# Patient Record
Sex: Female | Born: 1965
Health system: Southern US, Community
[De-identification: ages and names within clinical notes are randomized; demographics above are authoritative.]

## PROBLEM LIST (undated history)

## (undated) DIAGNOSIS — I2510 Atherosclerotic heart disease of native coronary artery without angina pectoris: Secondary | ICD-10-CM

## (undated) DIAGNOSIS — J449 Chronic obstructive pulmonary disease, unspecified: Secondary | ICD-10-CM

## (undated) DIAGNOSIS — F319 Bipolar disorder, unspecified: Secondary | ICD-10-CM

## (undated) DIAGNOSIS — M545 Low back pain, unspecified: Secondary | ICD-10-CM

## (undated) DIAGNOSIS — R011 Cardiac murmur, unspecified: Secondary | ICD-10-CM

## (undated) DIAGNOSIS — IMO0002 Reserved for concepts with insufficient information to code with codable children: Secondary | ICD-10-CM

## (undated) DIAGNOSIS — K859 Acute pancreatitis without necrosis or infection, unspecified: Secondary | ICD-10-CM

## (undated) DIAGNOSIS — M1990 Unspecified osteoarthritis, unspecified site: Secondary | ICD-10-CM

## (undated) DIAGNOSIS — I639 Cerebral infarction, unspecified: Secondary | ICD-10-CM

## (undated) DIAGNOSIS — R51 Headache: Secondary | ICD-10-CM

## (undated) DIAGNOSIS — M542 Cervicalgia: Secondary | ICD-10-CM

## (undated) DIAGNOSIS — F329 Major depressive disorder, single episode, unspecified: Secondary | ICD-10-CM

## (undated) DIAGNOSIS — G8929 Other chronic pain: Secondary | ICD-10-CM

## (undated) DIAGNOSIS — F32A Depression, unspecified: Secondary | ICD-10-CM

## (undated) DIAGNOSIS — F419 Anxiety disorder, unspecified: Secondary | ICD-10-CM

## (undated) HISTORY — DX: Acute pancreatitis without necrosis or infection, unspecified: K85.90

## (undated) HISTORY — PX: CHOLECYSTECTOMY: SHX55

## (undated) HISTORY — DX: Major depressive disorder, single episode, unspecified: F32.9

## (undated) HISTORY — DX: Bipolar disorder, unspecified: F31.9

## (undated) HISTORY — DX: Cervicalgia: M54.2

## (undated) HISTORY — DX: Low back pain, unspecified: M54.50

## (undated) HISTORY — DX: Atherosclerotic heart disease of native coronary artery without angina pectoris: I25.10

## (undated) HISTORY — DX: Cerebral infarction, unspecified: I63.9

## (undated) HISTORY — DX: Anxiety disorder, unspecified: F41.9

## (undated) HISTORY — DX: Other chronic pain: G89.29

## (undated) HISTORY — PX: APPENDECTOMY: SHX54

## (undated) HISTORY — DX: Depression, unspecified: F32.A

## (undated) HISTORY — DX: Chronic obstructive pulmonary disease, unspecified: J44.9

## (undated) HISTORY — DX: Low back pain: M54.5

## (undated) HISTORY — DX: Unspecified osteoarthritis, unspecified site: M19.90

## (undated) HISTORY — PX: NECK SURGERY: SHX720

## (undated) HISTORY — DX: Reserved for concepts with insufficient information to code with codable children: IMO0002

---

## 1999-08-22 HISTORY — PX: KNEE ARTHROSCOPY: SUR90

## 2011-11-23 ENCOUNTER — Encounter: Payer: Self-pay | Admitting: Family Medicine

## 2011-11-23 ENCOUNTER — Ambulatory Visit (INDEPENDENT_AMBULATORY_CARE_PROVIDER_SITE_OTHER): Payer: Medicare Other | Admitting: Family Medicine

## 2011-11-23 VITALS — BP 108/82 | HR 71 | Resp 15 | Ht 67.0 in | Wt 186.8 lb

## 2011-11-23 DIAGNOSIS — Z720 Tobacco use: Secondary | ICD-10-CM

## 2011-11-23 DIAGNOSIS — J40 Bronchitis, not specified as acute or chronic: Secondary | ICD-10-CM | POA: Diagnosis not present

## 2011-11-23 DIAGNOSIS — M549 Dorsalgia, unspecified: Secondary | ICD-10-CM

## 2011-11-23 DIAGNOSIS — M542 Cervicalgia: Secondary | ICD-10-CM

## 2011-11-23 DIAGNOSIS — E119 Type 2 diabetes mellitus without complications: Secondary | ICD-10-CM | POA: Diagnosis not present

## 2011-11-23 DIAGNOSIS — F319 Bipolar disorder, unspecified: Secondary | ICD-10-CM

## 2011-11-23 DIAGNOSIS — G8929 Other chronic pain: Secondary | ICD-10-CM

## 2011-11-23 DIAGNOSIS — F17200 Nicotine dependence, unspecified, uncomplicated: Secondary | ICD-10-CM

## 2011-11-23 MED ORDER — IBUPROFEN 600 MG PO TABS: 600.0000 mg | ORAL_TABLET | Freq: Four times a day (QID) | ORAL | Status: AC | PRN

## 2011-11-23 MED ORDER — PREDNISONE 10 MG PO TABS: ORAL_TABLET | ORAL | Status: DC

## 2011-11-23 MED ORDER — DOXYCYCLINE HYCLATE 100 MG PO TABS: 100.0000 mg | ORAL_TABLET | Freq: Two times a day (BID) | ORAL | Status: AC

## 2011-11-23 MED ORDER — SERTRALINE HCL 50 MG PO TABS: ORAL_TABLET | ORAL | Status: DC

## 2011-11-23 MED ORDER — LAMOTRIGINE 150 MG PO TABS: 150.0000 mg | ORAL_TABLET | Freq: Two times a day (BID) | ORAL | Status: DC

## 2011-11-23 MED ORDER — ALBUTEROL SULFATE HFA 108 (90 BASE) MCG/ACT IN AERS: 2.0000 | INHALATION_SPRAY | Freq: Four times a day (QID) | RESPIRATORY_TRACT | Status: DC | PRN

## 2011-11-23 MED ORDER — LAMOTRIGINE 150 MG PO TABS: 150.0000 mg | ORAL_TABLET | Freq: Every day | ORAL | Status: DC

## 2011-11-23 MED ORDER — QUETIAPINE FUMARATE 25 MG PO TABS: 25.0000 mg | ORAL_TABLET | Freq: Every day | ORAL | Status: DC

## 2011-11-23 MED ORDER — CYCLOBENZAPRINE HCL 10 MG PO TABS: 10.0000 mg | ORAL_TABLET | Freq: Three times a day (TID) | ORAL | Status: DC | PRN

## 2011-11-23 NOTE — Assessment & Plan Note (Signed)
Flexeril and NSAIDS given,

## 2011-11-23 NOTE — Assessment & Plan Note (Signed)
Will treat with IM solumedrol, prednisone taper and antibiotics. She likley has some COPD with her tobacco history, Will obtain records, she will need PFT
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
## 2011-11-23 NOTE — Patient Instructions (Signed)
Schedule a physical exam in 6 weeks Continue current medications I will send a referral to [facility] Start the prednisone tomorrow Take the antibiotics as prescribed You need to quit smoking!! I will review your records

## 2011-11-23 NOTE — Assessment & Plan Note (Signed)
Check A1C 

## 2011-11-23 NOTE — Assessment & Plan Note (Signed)
Medications have been refilled, she needs to be followed by psych Will refer to [facility]

## 2011-11-23 NOTE — Assessment & Plan Note (Signed)
Discussed importance of tobacco cessation. 

## 2011-11-24 DIAGNOSIS — J40 Bronchitis, not specified as acute or chronic: Secondary | ICD-10-CM | POA: Diagnosis not present

## 2011-11-24 MED ORDER — METHYLPREDNISOLONE SODIUM SUCC 125 MG IJ SOLR
125.0000 mg | Freq: Once | INTRAMUSCULAR | Status: AC
  Administered 2011-11-24: 125 mg via INTRAMUSCULAR

## 2011-11-24 NOTE — Progress Notes (Signed)
Addended by: Abner Greenspan on: 11/24/2011 11:08 AM   Modules accepted: Orders

## 2011-12-17 DIAGNOSIS — M542 Cervicalgia: Secondary | ICD-10-CM | POA: Diagnosis not present

## 2011-12-17 DIAGNOSIS — M5030 Other cervical disc degeneration, unspecified cervical region: Secondary | ICD-10-CM | POA: Diagnosis not present

## 2011-12-17 DIAGNOSIS — M47812 Spondylosis without myelopathy or radiculopathy, cervical region: Secondary | ICD-10-CM | POA: Diagnosis not present

## 2011-12-17 DIAGNOSIS — R209 Unspecified disturbances of skin sensation: Secondary | ICD-10-CM | POA: Diagnosis not present

## 2011-12-22 ENCOUNTER — Telehealth: Payer: Self-pay | Admitting: Family Medicine

## 2011-12-27 MED ORDER — DOXYCYCLINE HYCLATE 100 MG PO TABS: 100.0000 mg | ORAL_TABLET | Freq: Two times a day (BID) | ORAL | Status: AC

## 2011-12-27 NOTE — Telephone Encounter (Signed)
Called and left message for pt to return call.  

## 2011-12-27 NOTE — Telephone Encounter (Signed)
Spoke with pt and she is aware of abt order. Appointment for 5/9 cancelled. Will continue with appointment for 5/16.

## 2011-12-27 NOTE — Telephone Encounter (Signed)
Will send in doxyc to cover lyme disease from tick bite, this can be rechecked at her appt next week. If she gets fever, joint swelling or worsening rash please call for earlier appt

## 2011-12-28 ENCOUNTER — Telehealth: Payer: Self-pay

## 2011-12-28 ENCOUNTER — Ambulatory Visit: Payer: Medicare Other | Admitting: Family Medicine

## 2011-12-28 NOTE — Telephone Encounter (Signed)
Called and left message for pt to return call.  

## 2011-12-28 NOTE — Telephone Encounter (Signed)
She can take the ibuprofen I prescribed for her neck pain along with her flexeril for the pain. There is a small interaction with tramadol, she can try as directed for 24 hours if she has nausea/vomiting, abd pain or headache she needs to stop. Keep her follow-up appt

## 2012-01-04 ENCOUNTER — Encounter: Payer: Medicare Other | Admitting: Family Medicine

## 2012-01-04 NOTE — Telephone Encounter (Signed)
Pt has appointment today.  

## 2012-01-10 DIAGNOSIS — S335XXA Sprain of ligaments of lumbar spine, initial encounter: Secondary | ICD-10-CM | POA: Diagnosis not present

## 2012-01-10 DIAGNOSIS — IMO0002 Reserved for concepts with insufficient information to code with codable children: Secondary | ICD-10-CM | POA: Diagnosis not present

## 2012-01-10 DIAGNOSIS — F17200 Nicotine dependence, unspecified, uncomplicated: Secondary | ICD-10-CM | POA: Diagnosis not present

## 2012-01-10 DIAGNOSIS — E119 Type 2 diabetes mellitus without complications: Secondary | ICD-10-CM | POA: Diagnosis not present

## 2012-01-10 DIAGNOSIS — Z79899 Other long term (current) drug therapy: Secondary | ICD-10-CM | POA: Diagnosis not present

## 2012-01-10 DIAGNOSIS — F319 Bipolar disorder, unspecified: Secondary | ICD-10-CM | POA: Diagnosis not present

## 2012-01-11 ENCOUNTER — Encounter: Payer: Medicare Other | Admitting: Family Medicine

## 2012-01-11 ENCOUNTER — Encounter: Payer: Self-pay | Admitting: Family Medicine

## 2012-01-16 ENCOUNTER — Telehealth: Payer: Self-pay | Admitting: Family Medicine

## 2012-01-23 NOTE — Telephone Encounter (Signed)
Pt re-informed on the advice given in the past about interaction with tramadol and Zoloft. Understanding to stop if any sign of interaction.  Patient states that she has an appointment on 6/7

## 2012-01-26 ENCOUNTER — Encounter: Payer: Self-pay | Admitting: Family Medicine

## 2012-01-26 ENCOUNTER — Ambulatory Visit (INDEPENDENT_AMBULATORY_CARE_PROVIDER_SITE_OTHER): Payer: Medicare Other | Admitting: Family Medicine

## 2012-01-26 VITALS — BP 142/78 | HR 106 | Resp 18 | Ht 67.0 in | Wt 183.1 lb

## 2012-01-26 DIAGNOSIS — R7989 Other specified abnormal findings of blood chemistry: Secondary | ICD-10-CM

## 2012-01-26 DIAGNOSIS — M549 Dorsalgia, unspecified: Secondary | ICD-10-CM | POA: Diagnosis not present

## 2012-01-26 DIAGNOSIS — E119 Type 2 diabetes mellitus without complications: Secondary | ICD-10-CM

## 2012-01-26 DIAGNOSIS — G8929 Other chronic pain: Secondary | ICD-10-CM | POA: Diagnosis not present

## 2012-01-26 DIAGNOSIS — F17200 Nicotine dependence, unspecified, uncomplicated: Secondary | ICD-10-CM

## 2012-01-26 DIAGNOSIS — Z0000 Encounter for general adult medical examination without abnormal findings: Secondary | ICD-10-CM

## 2012-01-26 DIAGNOSIS — R946 Abnormal results of thyroid function studies: Secondary | ICD-10-CM | POA: Diagnosis not present

## 2012-01-26 DIAGNOSIS — IMO0001 Reserved for inherently not codable concepts without codable children: Secondary | ICD-10-CM

## 2012-01-26 DIAGNOSIS — Z720 Tobacco use: Secondary | ICD-10-CM

## 2012-01-26 DIAGNOSIS — R030 Elevated blood-pressure reading, without diagnosis of hypertension: Secondary | ICD-10-CM

## 2012-01-26 DIAGNOSIS — F319 Bipolar disorder, unspecified: Secondary | ICD-10-CM

## 2012-01-26 DIAGNOSIS — Z1231 Encounter for screening mammogram for malignant neoplasm of breast: Secondary | ICD-10-CM

## 2012-01-26 LAB — CBC
HCT: 42.1 % (ref 36.0–46.0)
Hemoglobin: 14.2 g/dL (ref 12.0–15.0)
MCH: 29.8 pg (ref 26.0–34.0)
MCHC: 33.7 g/dL (ref 30.0–36.0)
RBC: 4.76 MIL/uL (ref 3.87–5.11)

## 2012-01-26 MED ORDER — HYDROCODONE-ACETAMINOPHEN 5-500 MG PO TABS: 1.0000 | ORAL_TABLET | ORAL | Status: DC | PRN

## 2012-01-26 NOTE — Patient Instructions (Addendum)
I recommend eye doctor visit once a year, Dental visit every 6 months Continue meds We will call with lab results You need to quit smoking! Letter from PAP Smear will be mailed Call psychiatrist back  [phone] Referral to [facility] Continue flexeril for back F/U 4 months

## 2012-01-26 NOTE — Progress Notes (Signed)
  Subjective:    Patient ID: Erica Herrera, female    DOB: 1966/07/04, 46 y.o.   MRN: [id number]  HPI  Pt presents to f/u meds, however she was not able to make previous visit and is overdue for physical therefore this visit today became physical. She has not seen psychiatry states she could not get through No change in medications She is having increased back pain, seen at [facility] ED after her sink fell off the wall and she tried to catch it recently. No prescriptions given per report. No records have been received regarding her chronic back pain Treated for tick bite a few weeks ago with doxycyline. She also states she has had abnormal thyroid testing in the past   Review of Systems GEN- denies fatigue, fever, weight loss,weakness, recent illness HEENT- denies eye drainage, change in vision, nasal discharge, CVS- denies chest pain, palpitations RESP- denies SOB, cough, wheeze ABD- denies N/V, change in stools, abd pain GU- denies dysuria, hematuria, dribbling, incontinence MSK- + joint pain, +muscle aches, injury Neuro- denies headache, dizziness, syncope, seizure activity      Objective:   Physical Exam  GEN- NAD, alert and oriented x3 HEENT- PERRL, EOMI, non injected sclera, pink conjunctiva, MMM, oropharynx clear, TM clear bilat Neck- Supple, no thyromegaly CVS- RRR, no murmur RESP-CTAB Breast- normal symmetry, no nipple inversion,no nipple drainage, no nodules or lumps felt Nodes- no axillary nodes GU- normal external genitalia, vaginal mucosa pink and moist, cervix visualized no growth, no blood form os, no discharge, no CMT, no ovarian masses, uterus normal size EXT- No edema Pulses- Radial, DP- 2+       Assessment & Plan:

## 2012-01-27 LAB — COMPREHENSIVE METABOLIC PANEL
Albumin: 4.3 g/dL (ref 3.5–5.2)
Alkaline Phosphatase: 101 U/L (ref 39–117)
BUN: 9 mg/dL (ref 6–23)
CO2: 27 mEq/L (ref 19–32)
Glucose, Bld: 97 mg/dL (ref 70–99)
Potassium: 4.6 mEq/L (ref 3.5–5.3)
Sodium: 139 mEq/L (ref 135–145)
Total Protein: 7.2 g/dL (ref 6.0–8.3)

## 2012-01-27 LAB — LIPID PANEL
Cholesterol: 207 mg/dL — ABNORMAL HIGH (ref 0–200)
HDL: 41 mg/dL (ref >39)
Triglycerides: 191 mg/dL — ABNORMAL HIGH (ref <150)

## 2012-01-27 LAB — HEMOGLOBIN A1C
Hgb A1c MFr Bld: 6.9 % — ABNORMAL HIGH (ref <5.7)
Mean Plasma Glucose: 151 mg/dL — ABNORMAL HIGH (ref <117)

## 2012-01-28 DIAGNOSIS — Z0000 Encounter for general adult medical examination without abnormal findings: Secondary | ICD-10-CM | POA: Insufficient documentation

## 2012-01-28 NOTE — Assessment & Plan Note (Signed)
Goal < 130/80, she will need lose dose ace inhibitor

## 2012-01-28 NOTE — Assessment & Plan Note (Signed)
No meds, labs reveal A1C 6.9% will call to discuss starting metformin

## 2012-01-28 NOTE — Assessment & Plan Note (Signed)
PAP Smear done, will need Mammogram to be set up

## 2012-01-28 NOTE — Assessment & Plan Note (Signed)
I personally called [facility] before she left, they have bene unable to contact her with her previous numbers, she was given number and will call and schedule an appt as they have accepted her as a patient. Do to there protocol I could not scheduled the appt

## 2012-01-28 NOTE — Assessment & Plan Note (Addendum)
She have release for her previous orthopedic doctor, given 20 tablets of vicodin- continue other meds, no chronic pain meds until I can verify her records, she asked to be sent to a pain clinic

## 2012-01-28 NOTE — Assessment & Plan Note (Signed)
   Discussed smoking cessation. 

## 2012-01-29 ENCOUNTER — Other Ambulatory Visit (HOSPITAL_COMMUNITY)
Admission: RE | Admit: 2012-01-29 | Discharge: 2012-01-29 | Disposition: A | Discharge status: Home / Self Care | Payer: Medicare Other | Source: Ambulatory Visit | Attending: Family Medicine | Admitting: Family Medicine

## 2012-01-29 DIAGNOSIS — Z124 Encounter for screening for malignant neoplasm of cervix: Secondary | ICD-10-CM | POA: Diagnosis not present

## 2012-01-29 MED ORDER — METFORMIN HCL ER 500 MG PO TB24: 500.0000 mg | ORAL_TABLET | Freq: Every day | ORAL | Status: DC

## 2012-01-29 NOTE — Progress Notes (Signed)
Addended by: Milinda Antis F on: 01/29/2012 02:35 PM   Modules accepted: Orders

## 2012-02-06 ENCOUNTER — Other Ambulatory Visit: Payer: Self-pay | Admitting: Family Medicine

## 2012-02-06 ENCOUNTER — Telehealth: Payer: Self-pay | Admitting: Family Medicine

## 2012-02-06 MED ORDER — SERTRALINE HCL 50 MG PO TABS: ORAL_TABLET | ORAL | Status: DC

## 2012-02-06 MED ORDER — QUETIAPINE FUMARATE 25 MG PO TABS: 25.0000 mg | ORAL_TABLET | ORAL | Status: DC | PRN

## 2012-02-06 MED ORDER — LAMOTRIGINE 150 MG PO TABS: 150.0000 mg | ORAL_TABLET | Freq: Two times a day (BID) | ORAL | Status: DC

## 2012-02-06 NOTE — Telephone Encounter (Signed)
Medication filled, please give her the lab results, It seems we tried to reach her many times

## 2012-02-26 ENCOUNTER — Telehealth: Payer: Self-pay | Admitting: Family Medicine

## 2012-02-26 DIAGNOSIS — G8929 Other chronic pain: Secondary | ICD-10-CM

## 2012-02-26 DIAGNOSIS — Z981 Arthrodesis status: Secondary | ICD-10-CM

## 2012-02-26 DIAGNOSIS — M549 Dorsalgia, unspecified: Secondary | ICD-10-CM

## 2012-02-28 MED ORDER — NAPROXEN 500 MG PO TABS: 500.0000 mg | ORAL_TABLET | Freq: Two times a day (BID) | ORAL | Status: DC

## 2012-02-28 NOTE — Telephone Encounter (Signed)
Ever since she hurt her back again she has been having a lot of cramps especially at night. Sometimes so bad she has to wake up her daughter to help her. They have also been feeling very weak like they are going to give out on her x 2 months. Med given last time doesn't help. Doesn't know what to do anymore. Earliest she could come in was next week so she wanted to see what she could do in the meantime

## 2012-02-28 NOTE — Telephone Encounter (Signed)
Called patient and left message for them to return call at the office   

## 2012-02-28 NOTE — Telephone Encounter (Signed)
Naporsyn sent twice a day, please let her know she has been referred to a pain clinic

## 2012-03-01 NOTE — Telephone Encounter (Signed)
Patient aware.

## 2012-03-05 ENCOUNTER — Telehealth: Payer: Self-pay | Admitting: Family Medicine

## 2012-03-06 ENCOUNTER — Other Ambulatory Visit: Payer: Self-pay

## 2012-03-06 MED ORDER — CYCLOBENZAPRINE HCL 10 MG PO TABS: 10.0000 mg | ORAL_TABLET | Freq: Three times a day (TID) | ORAL | Status: DC | PRN

## 2012-03-06 NOTE — Telephone Encounter (Signed)
Med resent 

## 2012-03-07 ENCOUNTER — Encounter: Payer: Self-pay | Admitting: Family Medicine

## 2012-03-07 ENCOUNTER — Ambulatory Visit (INDEPENDENT_AMBULATORY_CARE_PROVIDER_SITE_OTHER): Payer: Medicare Other | Admitting: Family Medicine

## 2012-03-07 VITALS — BP 140/80 | HR 78 | Resp 18 | Ht 67.0 in | Wt 187.0 lb

## 2012-03-07 DIAGNOSIS — M5126 Other intervertebral disc displacement, lumbar region: Secondary | ICD-10-CM

## 2012-03-07 DIAGNOSIS — M549 Dorsalgia, unspecified: Secondary | ICD-10-CM

## 2012-03-07 DIAGNOSIS — G8929 Other chronic pain: Secondary | ICD-10-CM

## 2012-03-07 DIAGNOSIS — Z79899 Other long term (current) drug therapy: Secondary | ICD-10-CM

## 2012-03-07 DIAGNOSIS — M542 Cervicalgia: Secondary | ICD-10-CM

## 2012-03-07 MED ORDER — CARISOPRODOL 350 MG PO TABS: 350.0000 mg | ORAL_TABLET | Freq: Three times a day (TID) | ORAL | Status: AC | PRN

## 2012-03-07 MED ORDER — OXYCODONE-ACETAMINOPHEN 5-325 MG PO TABS: 1.0000 | ORAL_TABLET | Freq: Four times a day (QID) | ORAL | Status: DC | PRN

## 2012-03-07 NOTE — Progress Notes (Signed)
  Subjective:    Patient ID: Erica Herrera, female    DOB: 03-28-1966, 46 y.o.   MRN: [id number]  HPI Patient presents to followup acute on chronic back pain. Approximately 6 weeks ago she was seen after she had an injury in her home where a sink fell on her. She's had significantly increased back pain since then with radiation down her legs. She denies any change in bowel or bladder. Her records were received which shows lumbar disc bulge with small herniation as well as facet hypertrophy. Chronic back pain was treated with multiple medications including narcotics as well as methadone, TENS unit, physical therapy and epidural injections. Her last MRI was in 2009. She also complains of chronic neck pain she has history of C5-C6 spinal fusion as well as a right C6-7 foraminotomy.    Review of Systems  GEN- denies fatigue, fever, weight loss,weakness, recent illness HEENT- denies eye drainage, change in vision, nasal discharge, CVS- denies chest pain, palpitations RESP- denies SOB, cough, wheeze ABD- denies N/V, change in stools, abd pain GU- denies dysuria, hematuria, dribbling, incontinence MSK- + joint pain,+ muscle aches, injury Neuro- denies headache, dizziness, syncope, seizure activity      Objective:   Physical Exam GEN- NAD, alert and oriented x3 HEENT- PERRL, EOMI, non injected sclera, pink conjunctiva, MMM, oropharynx clear Neck- Supple, no thryomegaly CVS- RRR, no murmur RESP-few scattered wheeze  EXT- No edema Pulses- Radial, DP- 2+ Back-TTP lumbar spine, +SLR Right side, decreased ROM secondary to pain Neuro- antalgic gait, sensation intact,        Assessment & Plan:

## 2012-03-07 NOTE — Patient Instructions (Addendum)
I will set you up for an MRI  Pain medications to be started today SOMA for muscle relaxant Referral to neurosurgery  F/u with Psychiatry  Keep previous f/u appt

## 2012-03-08 LAB — DRUG SCREEN, URINE
Benzodiazepines.: NEGATIVE
Marijuana Metabolite: NEGATIVE
Methadone: NEGATIVE
Phencyclidine (PCP): NEGATIVE
Propoxyphene: NEGATIVE

## 2012-03-11 NOTE — Assessment & Plan Note (Signed)
Chronic pain per above

## 2012-03-11 NOTE — Assessment & Plan Note (Signed)
She is worsening back pain with acute injury. Since this was greater than 6 weeks ago was treated with conservative therapy I will go ahead and repeat image her as it's been a few years. She will need neurosurgery referral. If nothing can be done she will be sent to pain management. She was started on pain contract today urine drug screen was obtained

## 2012-03-12 ENCOUNTER — Telehealth: Payer: Self-pay | Admitting: Family Medicine

## 2012-03-13 ENCOUNTER — Other Ambulatory Visit (HOSPITAL_COMMUNITY): Payer: Medicare Other

## 2012-03-13 NOTE — Telephone Encounter (Signed)
Patient is aware 

## 2012-03-18 ENCOUNTER — Telehealth: Payer: Self-pay | Admitting: Family Medicine

## 2012-03-18 NOTE — Telephone Encounter (Signed)
Pa completed. Awaiting signature.

## 2012-03-19 ENCOUNTER — Ambulatory Visit (HOSPITAL_COMMUNITY)
Admission: RE | Admit: 2012-03-19 | Discharge: 2012-03-19 | Disposition: A | Discharge status: Home / Self Care | Payer: Medicare Other | Source: Ambulatory Visit | Attending: Family Medicine | Admitting: Family Medicine

## 2012-03-19 DIAGNOSIS — M545 Low back pain, unspecified: Secondary | ICD-10-CM | POA: Diagnosis not present

## 2012-03-19 DIAGNOSIS — M47817 Spondylosis without myelopathy or radiculopathy, lumbosacral region: Secondary | ICD-10-CM | POA: Insufficient documentation

## 2012-03-19 DIAGNOSIS — G8929 Other chronic pain: Secondary | ICD-10-CM

## 2012-03-19 DIAGNOSIS — M51379 Other intervertebral disc degeneration, lumbosacral region without mention of lumbar back pain or lower extremity pain: Secondary | ICD-10-CM | POA: Insufficient documentation

## 2012-03-19 DIAGNOSIS — R209 Unspecified disturbances of skin sensation: Secondary | ICD-10-CM | POA: Diagnosis not present

## 2012-03-19 DIAGNOSIS — M5126 Other intervertebral disc displacement, lumbar region: Secondary | ICD-10-CM | POA: Insufficient documentation

## 2012-03-19 DIAGNOSIS — M5137 Other intervertebral disc degeneration, lumbosacral region: Secondary | ICD-10-CM | POA: Insufficient documentation

## 2012-03-19 DIAGNOSIS — Z043 Encounter for examination and observation following other accident: Secondary | ICD-10-CM | POA: Diagnosis not present

## 2012-03-20 ENCOUNTER — Other Ambulatory Visit: Payer: Self-pay | Admitting: Family Medicine

## 2012-03-20 DIAGNOSIS — M549 Dorsalgia, unspecified: Secondary | ICD-10-CM

## 2012-03-20 DIAGNOSIS — M5126 Other intervertebral disc displacement, lumbar region: Secondary | ICD-10-CM

## 2012-03-20 DIAGNOSIS — M5136 Other intervertebral disc degeneration, lumbar region: Secondary | ICD-10-CM

## 2012-03-22 ENCOUNTER — Telehealth: Payer: Self-pay | Admitting: Family Medicine

## 2012-03-25 NOTE — Telephone Encounter (Signed)
Patient aware.

## 2012-03-26 DIAGNOSIS — F4310 Post-traumatic stress disorder, unspecified: Secondary | ICD-10-CM | POA: Diagnosis not present

## 2012-03-26 DIAGNOSIS — F3189 Other bipolar disorder: Secondary | ICD-10-CM | POA: Diagnosis not present

## 2012-03-27 DIAGNOSIS — M5126 Other intervertebral disc displacement, lumbar region: Secondary | ICD-10-CM | POA: Diagnosis not present

## 2012-04-01 DIAGNOSIS — M47817 Spondylosis without myelopathy or radiculopathy, lumbosacral region: Secondary | ICD-10-CM | POA: Diagnosis not present

## 2012-04-01 DIAGNOSIS — G8929 Other chronic pain: Secondary | ICD-10-CM | POA: Diagnosis not present

## 2012-04-01 DIAGNOSIS — Z79899 Other long term (current) drug therapy: Secondary | ICD-10-CM | POA: Diagnosis not present

## 2012-04-01 DIAGNOSIS — M545 Low back pain: Secondary | ICD-10-CM | POA: Diagnosis not present

## 2012-04-10 ENCOUNTER — Telehealth: Payer: Self-pay | Admitting: Family Medicine

## 2012-04-12 NOTE — Telephone Encounter (Signed)
Not due until next week.

## 2012-04-15 ENCOUNTER — Other Ambulatory Visit: Payer: Self-pay

## 2012-04-15 MED ORDER — OXYCODONE-ACETAMINOPHEN 5-325 MG PO TABS: 1.0000 | ORAL_TABLET | Freq: Four times a day (QID) | ORAL | Status: AC | PRN

## 2012-04-16 NOTE — Telephone Encounter (Signed)
rx ready to be collected  

## 2012-04-24 DIAGNOSIS — M545 Low back pain: Secondary | ICD-10-CM | POA: Diagnosis not present

## 2012-04-24 DIAGNOSIS — G894 Chronic pain syndrome: Secondary | ICD-10-CM | POA: Diagnosis not present

## 2012-04-24 DIAGNOSIS — M5137 Other intervertebral disc degeneration, lumbosacral region: Secondary | ICD-10-CM | POA: Diagnosis not present

## 2012-04-24 DIAGNOSIS — IMO0002 Reserved for concepts with insufficient information to code with codable children: Secondary | ICD-10-CM | POA: Diagnosis not present

## 2012-05-15 ENCOUNTER — Other Ambulatory Visit: Payer: Self-pay | Admitting: Neurosurgery

## 2012-05-15 DIAGNOSIS — M5126 Other intervertebral disc displacement, lumbar region: Secondary | ICD-10-CM | POA: Diagnosis not present

## 2012-05-21 ENCOUNTER — Other Ambulatory Visit (HOSPITAL_COMMUNITY): Payer: Medicare Other

## 2012-05-22 ENCOUNTER — Encounter (HOSPITAL_COMMUNITY)
Admission: RE | Admit: 2012-05-22 | Discharge: 2012-05-22 | Disposition: A | Discharge status: Home / Self Care | Payer: Medicare Other | Source: Ambulatory Visit | Attending: Neurosurgery | Admitting: Neurosurgery

## 2012-05-22 ENCOUNTER — Encounter (HOSPITAL_COMMUNITY): Payer: Self-pay

## 2012-05-22 DIAGNOSIS — Z01818 Encounter for other preprocedural examination: Secondary | ICD-10-CM | POA: Diagnosis not present

## 2012-05-22 DIAGNOSIS — M5126 Other intervertebral disc displacement, lumbar region: Secondary | ICD-10-CM | POA: Diagnosis not present

## 2012-05-22 DIAGNOSIS — Z01812 Encounter for preprocedural laboratory examination: Secondary | ICD-10-CM | POA: Diagnosis not present

## 2012-05-22 DIAGNOSIS — Z01811 Encounter for preprocedural respiratory examination: Secondary | ICD-10-CM | POA: Diagnosis not present

## 2012-05-22 DIAGNOSIS — Z01810 Encounter for preprocedural cardiovascular examination: Secondary | ICD-10-CM | POA: Diagnosis not present

## 2012-05-22 HISTORY — DX: Cardiac murmur, unspecified: R01.1

## 2012-05-22 LAB — ABO/RH: ABO/RH(D): B NEG

## 2012-05-22 LAB — BASIC METABOLIC PANEL
BUN: 5 mg/dL — ABNORMAL LOW (ref 6–23)
CO2: 27 mEq/L (ref 19–32)
GFR calc non Af Amer: 86 mL/min — ABNORMAL LOW (ref >90)
Glucose, Bld: 92 mg/dL (ref 70–99)
Potassium: 4.4 mEq/L (ref 3.5–5.1)
Sodium: 139 mEq/L (ref 135–145)

## 2012-05-22 LAB — CBC
HCT: 41.5 % (ref 36.0–46.0)
Hemoglobin: 13.9 g/dL (ref 12.0–15.0)
MCH: 31.2 pg (ref 26.0–34.0)
MCHC: 33.5 g/dL (ref 30.0–36.0)
MCV: 93.3 fL (ref 78.0–100.0)
RBC: 4.45 MIL/uL (ref 3.87–5.11)

## 2012-05-22 LAB — TYPE AND SCREEN: ABO/RH(D): B NEG

## 2012-05-22 LAB — HCG, SERUM, QUALITATIVE: Preg, Serum: NEGATIVE

## 2012-05-22 MED ORDER — VANCOMYCIN HCL IN DEXTROSE 1-5 GM/200ML-% IV SOLN
1000.0000 mg | INTRAVENOUS | Status: AC
  Administered 2012-05-23: 1000 mg via INTRAVENOUS
  Filled 2012-05-22: qty 200

## 2012-05-22 NOTE — Pre-Procedure Instructions (Addendum)
20 Erica Herrera  05/22/2012   Your procedure is scheduled on:  05/23/12  Report to [facility] at 700 AM.  Call this number if you have problems the morning of surgery: [phone]   Remember:   Do not eat food or drink:After Midnight.    Take these medicines the morning of surgery with A SIP OF WATER: seraquel, provental, lamictal,pain med, zoloft  STOPmulti vit,   Do not wear jewelry, make-up or nail polish.  Do not wear lotions, powders, or perfumes. You may wear deodorant.  Do not shave 48 hours prior to surgery. Men may shave face and neck.  Do not bring valuables to the hospital.  Contacts, dentures or bridgework may not be worn into surgery.  Leave suitcase in the car. After surgery it may be brought to your room.  For patients admitted to the hospital, checkout time is 11:00 AM the day of discharge.   Patients discharged the day of surgery will not be allowed to drive home.  Name and phone number of your driver: randi spouse [phone]  Special Instructions: Shower using CHG 2 nights before surgery and the night before surgery.  If you shower the day of surgery use CHG.  Use special wash - you have one bottle of CHG for all showers.  You should use approximately 1/3 of the bottle for each shower.   Please read over the following fact sheets that you were given: Pain Booklet, Coughing and Deep Breathing, Blood Transfusion Information, MRSA Information and Surgical Site Infection Prevention

## 2012-05-23 ENCOUNTER — Encounter (HOSPITAL_COMMUNITY): Payer: Self-pay | Admitting: Anesthesiology

## 2012-05-23 ENCOUNTER — Inpatient Hospital Stay (HOSPITAL_COMMUNITY): Payer: Medicare Other

## 2012-05-23 ENCOUNTER — Inpatient Hospital Stay (HOSPITAL_COMMUNITY): Payer: Medicare Other | Admitting: Anesthesiology

## 2012-05-23 ENCOUNTER — Encounter (HOSPITAL_COMMUNITY): Payer: Self-pay | Admitting: *Deleted

## 2012-05-23 ENCOUNTER — Encounter (HOSPITAL_COMMUNITY): Payer: Self-pay | Admitting: General Practice

## 2012-05-23 ENCOUNTER — Inpatient Hospital Stay (HOSPITAL_COMMUNITY)
Admission: RE | Admit: 2012-05-23 | Discharge: 2012-05-24 | DRG: 460 | Disposition: A | Discharge status: Home / Self Care | Payer: Medicare Other | Source: Ambulatory Visit | Attending: Neurosurgery | Admitting: Neurosurgery

## 2012-05-23 ENCOUNTER — Encounter (HOSPITAL_COMMUNITY)
Admission: RE | Disposition: A | Discharge status: Home / Self Care | Payer: Self-pay | Source: Ambulatory Visit | Attending: Neurosurgery

## 2012-05-23 DIAGNOSIS — M5126 Other intervertebral disc displacement, lumbar region: Principal | ICD-10-CM | POA: Diagnosis present

## 2012-05-23 DIAGNOSIS — Z23 Encounter for immunization: Secondary | ICD-10-CM | POA: Diagnosis not present

## 2012-05-23 DIAGNOSIS — Z01812 Encounter for preprocedural laboratory examination: Secondary | ICD-10-CM | POA: Diagnosis not present

## 2012-05-23 DIAGNOSIS — Z79899 Other long term (current) drug therapy: Secondary | ICD-10-CM

## 2012-05-23 DIAGNOSIS — F319 Bipolar disorder, unspecified: Secondary | ICD-10-CM | POA: Diagnosis present

## 2012-05-23 DIAGNOSIS — F17200 Nicotine dependence, unspecified, uncomplicated: Secondary | ICD-10-CM | POA: Diagnosis present

## 2012-05-23 DIAGNOSIS — M48061 Spinal stenosis, lumbar region without neurogenic claudication: Secondary | ICD-10-CM | POA: Diagnosis not present

## 2012-05-23 DIAGNOSIS — M129 Arthropathy, unspecified: Secondary | ICD-10-CM | POA: Diagnosis present

## 2012-05-23 DIAGNOSIS — Z01810 Encounter for preprocedural cardiovascular examination: Secondary | ICD-10-CM

## 2012-05-23 DIAGNOSIS — Z01818 Encounter for other preprocedural examination: Secondary | ICD-10-CM

## 2012-05-23 DIAGNOSIS — E119 Type 2 diabetes mellitus without complications: Secondary | ICD-10-CM | POA: Diagnosis present

## 2012-05-23 DIAGNOSIS — F411 Generalized anxiety disorder: Secondary | ICD-10-CM | POA: Diagnosis present

## 2012-05-23 DIAGNOSIS — M519 Unspecified thoracic, thoracolumbar and lumbosacral intervertebral disc disorder: Secondary | ICD-10-CM | POA: Diagnosis not present

## 2012-05-23 DIAGNOSIS — J45909 Unspecified asthma, uncomplicated: Secondary | ICD-10-CM | POA: Diagnosis present

## 2012-05-23 DIAGNOSIS — M549 Dorsalgia, unspecified: Secondary | ICD-10-CM | POA: Diagnosis not present

## 2012-05-23 DIAGNOSIS — M79609 Pain in unspecified limb: Secondary | ICD-10-CM | POA: Diagnosis not present

## 2012-05-23 HISTORY — DX: Headache: R51

## 2012-05-23 HISTORY — PX: POSTERIOR FUSION LUMBAR SPINE: SUR632

## 2012-05-23 LAB — GLUCOSE, CAPILLARY
Glucose-Capillary: 142 mg/dL — ABNORMAL HIGH (ref 70–99)
Glucose-Capillary: 105 mg/dL — ABNORMAL HIGH (ref 70–99)
Glucose-Capillary: 234 mg/dL — ABNORMAL HIGH (ref 70–99)

## 2012-05-23 SURGERY — POSTERIOR LUMBAR FUSION 1 LEVEL
Anesthesia: General | Site: Back | Wound class: Clean

## 2012-05-23 MED ORDER — SODIUM CHLORIDE 0.9 % IR SOLN
Status: DC | PRN
  Administered 2012-05-23: 11:00:00

## 2012-05-23 MED ORDER — MENTHOL 3 MG MT LOZG: 1.0000 | LOZENGE | OROMUCOSAL | Status: DC | PRN

## 2012-05-23 MED ORDER — NEOSTIGMINE METHYLSULFATE 1 MG/ML IJ SOLN
INTRAMUSCULAR | Status: DC | PRN
  Administered 2012-05-23: 5 mg via INTRAVENOUS

## 2012-05-23 MED ORDER — VANCOMYCIN HCL IN DEXTROSE 1-5 GM/200ML-% IV SOLN
1000.0000 mg | Freq: Once | INTRAVENOUS | Status: AC
  Administered 2012-05-23: 1000 mg via INTRAVENOUS
  Filled 2012-05-23: qty 200

## 2012-05-23 MED ORDER — OXYCODONE-ACETAMINOPHEN 5-325 MG PO TABS
1.0000 | ORAL_TABLET | ORAL | Status: DC | PRN
  Administered 2012-05-23 – 2012-05-24 (×2): 2 via ORAL
  Filled 2012-05-23 (×2): qty 2

## 2012-05-23 MED ORDER — MIDAZOLAM HCL 5 MG/5ML IJ SOLN
INTRAMUSCULAR | Status: DC | PRN
  Administered 2012-05-23: 2 mg via INTRAVENOUS

## 2012-05-23 MED ORDER — VECURONIUM BROMIDE 10 MG IV SOLR
INTRAVENOUS | Status: DC | PRN
  Administered 2012-05-23 (×2): 2 mg via INTRAVENOUS
  Administered 2012-05-23 (×2): 1 mg via INTRAVENOUS

## 2012-05-23 MED ORDER — HYDROMORPHONE HCL PF 1 MG/ML IJ SOLN
INTRAMUSCULAR | Status: DC | PRN
  Administered 2012-05-23 (×2): 0.5 mg via INTRAVENOUS

## 2012-05-23 MED ORDER — HYDROMORPHONE HCL PF 1 MG/ML IJ SOLN
0.2500 mg | INTRAMUSCULAR | Status: DC | PRN
  Administered 2012-05-23 (×4): 0.5 mg via INTRAVENOUS

## 2012-05-23 MED ORDER — LACTATED RINGERS IV SOLN
INTRAVENOUS | Status: DC | PRN
  Administered 2012-05-23: 10:00:00 via INTRAVENOUS

## 2012-05-23 MED ORDER — QUETIAPINE FUMARATE 25 MG PO TABS
25.0000 mg | ORAL_TABLET | Freq: Two times a day (BID) | ORAL | Status: DC
  Administered 2012-05-24: 25 mg via ORAL
  Filled 2012-05-23 (×4): qty 1

## 2012-05-23 MED ORDER — PROPOFOL 10 MG/ML IV BOLUS
INTRAVENOUS | Status: DC | PRN
  Administered 2012-05-23: 200 mg via INTRAVENOUS

## 2012-05-23 MED ORDER — ACETAMINOPHEN 325 MG PO TABS: 650.0000 mg | ORAL_TABLET | ORAL | Status: DC | PRN

## 2012-05-23 MED ORDER — SODIUM CHLORIDE 0.9 % IJ SOLN: 3.0000 mL | INTRAMUSCULAR | Status: DC | PRN

## 2012-05-23 MED ORDER — HYDROMORPHONE HCL PF 1 MG/ML IJ SOLN
INTRAMUSCULAR | Status: AC
  Filled 2012-05-23: qty 2

## 2012-05-23 MED ORDER — KCL IN DEXTROSE-NACL 20-5-0.45 MEQ/L-%-% IV SOLN
80.0000 mL/h | INTRAVENOUS | Status: DC
  Administered 2012-05-23: 80 mL/h via INTRAVENOUS
  Filled 2012-05-23 (×4): qty 1000

## 2012-05-23 MED ORDER — HYDROMORPHONE HCL PF 1 MG/ML IJ SOLN
1.0000 mg | INTRAMUSCULAR | Status: DC | PRN
  Administered 2012-05-24: 1 mg via INTRAMUSCULAR
  Filled 2012-05-23: qty 1

## 2012-05-23 MED ORDER — BUPIVACAINE HCL (PF) 0.5 % IJ SOLN
INTRAMUSCULAR | Status: DC | PRN
  Administered 2012-05-23: 20 mL

## 2012-05-23 MED ORDER — PHENOL 1.4 % MT LIQD: 1.0000 | OROMUCOSAL | Status: DC | PRN

## 2012-05-23 MED ORDER — SODIUM CHLORIDE 0.9 % IV SOLN: 250.0000 mL | INTRAVENOUS | Status: DC

## 2012-05-23 MED ORDER — SERTRALINE HCL 50 MG PO TABS
75.0000 mg | ORAL_TABLET | Freq: Every day | ORAL | Status: DC
  Administered 2012-05-23 – 2012-05-24 (×2): 75 mg via ORAL
  Filled 2012-05-23 (×2): qty 1

## 2012-05-23 MED ORDER — SODIUM CHLORIDE 0.9 % IV SOLN
INTRAVENOUS | Status: AC
  Filled 2012-05-23: qty 500

## 2012-05-23 MED ORDER — LIDOCAINE HCL (CARDIAC) 20 MG/ML IV SOLN
INTRAVENOUS | Status: DC | PRN
  Administered 2012-05-23: 70 mg via INTRAVENOUS

## 2012-05-23 MED ORDER — GLYCOPYRROLATE 0.2 MG/ML IJ SOLN
INTRAMUSCULAR | Status: DC | PRN
  Administered 2012-05-23: 0.6 mg via INTRAVENOUS

## 2012-05-23 MED ORDER — THROMBIN 20000 UNITS EX SOLR
CUTANEOUS | Status: DC | PRN
  Administered 2012-05-23: 11:00:00 via TOPICAL

## 2012-05-23 MED ORDER — ALBUTEROL SULFATE HFA 108 (90 BASE) MCG/ACT IN AERS
2.0000 | INHALATION_SPRAY | Freq: Four times a day (QID) | RESPIRATORY_TRACT | Status: DC | PRN
  Filled 2012-05-23: qty 6.7

## 2012-05-23 MED ORDER — ONDANSETRON HCL 4 MG/2ML IJ SOLN: 4.0000 mg | INTRAMUSCULAR | Status: DC | PRN

## 2012-05-23 MED ORDER — METFORMIN HCL ER 500 MG PO TB24
500.0000 mg | ORAL_TABLET | Freq: Every day | ORAL | Status: DC
  Administered 2012-05-24: 500 mg via ORAL
  Filled 2012-05-23 (×2): qty 1

## 2012-05-23 MED ORDER — ONDANSETRON HCL 4 MG/2ML IJ SOLN
INTRAMUSCULAR | Status: DC | PRN
  Administered 2012-05-23: 4 mg via INTRAVENOUS

## 2012-05-23 MED ORDER — FENTANYL CITRATE 0.05 MG/ML IJ SOLN
INTRAMUSCULAR | Status: DC | PRN
  Administered 2012-05-23 (×2): 50 ug via INTRAVENOUS
  Administered 2012-05-23: 150 ug via INTRAVENOUS

## 2012-05-23 MED ORDER — BACITRACIN 50000 UNITS IM SOLR
INTRAMUSCULAR | Status: AC
  Filled 2012-05-23: qty 1

## 2012-05-23 MED ORDER — INSULIN ASPART 100 UNIT/ML ~~LOC~~ SOLN: 0.0000 [IU] | Freq: Three times a day (TID) | SUBCUTANEOUS | Status: DC

## 2012-05-23 MED ORDER — ACETAMINOPHEN 650 MG RE SUPP: 650.0000 mg | RECTAL | Status: DC | PRN

## 2012-05-23 MED ORDER — OXYCODONE HCL 5 MG PO TABS: 5.0000 mg | ORAL_TABLET | Freq: Once | ORAL | Status: DC | PRN

## 2012-05-23 MED ORDER — SODIUM CHLORIDE 0.9 % IJ SOLN: 3.0000 mL | Freq: Two times a day (BID) | INTRAMUSCULAR | Status: DC

## 2012-05-23 MED ORDER — ROCURONIUM BROMIDE 100 MG/10ML IV SOLN
INTRAVENOUS | Status: DC | PRN
  Administered 2012-05-23: 5 mg via INTRAVENOUS

## 2012-05-23 MED ORDER — DROPERIDOL 2.5 MG/ML IJ SOLN: 0.6250 mg | INTRAMUSCULAR | Status: DC | PRN

## 2012-05-23 MED ORDER — LAMOTRIGINE 150 MG PO TABS
150.0000 mg | ORAL_TABLET | Freq: Two times a day (BID) | ORAL | Status: DC
  Administered 2012-05-23 – 2012-05-24 (×2): 150 mg via ORAL
  Filled 2012-05-23 (×4): qty 1

## 2012-05-23 MED ORDER — INSULIN ASPART 100 UNIT/ML ~~LOC~~ SOLN: 0.0000 [IU] | Freq: Every day | SUBCUTANEOUS | Status: DC

## 2012-05-23 MED ORDER — INFLUENZA VIRUS VACC SPLIT PF IM SUSP
0.5000 mL | INTRAMUSCULAR | Status: AC
  Administered 2012-05-24: 0.5 mL via INTRAMUSCULAR
  Filled 2012-05-23: qty 0.5

## 2012-05-23 MED ORDER — CYCLOBENZAPRINE HCL 10 MG PO TABS
10.0000 mg | ORAL_TABLET | Freq: Three times a day (TID) | ORAL | Status: DC | PRN
  Administered 2012-05-23 – 2012-05-24 (×2): 10 mg via ORAL
  Filled 2012-05-23 (×2): qty 1

## 2012-05-23 MED ORDER — OXYCODONE HCL 5 MG/5ML PO SOLN: 5.0000 mg | Freq: Once | ORAL | Status: DC | PRN

## 2012-05-23 MED ORDER — 0.9 % SODIUM CHLORIDE (POUR BTL) OPTIME
TOPICAL | Status: DC | PRN
  Administered 2012-05-23: 1000 mL

## 2012-05-23 SURGICAL SUPPLY — 62 items
BAG DECANTER FOR FLEXI CONT (MISCELLANEOUS) ×2 IMPLANT
BENZOIN TINCTURE PRP APPL 2/3 (GAUZE/BANDAGES/DRESSINGS) ×2 IMPLANT
BLADE SURG ROTATE 9660 (MISCELLANEOUS) IMPLANT
BONE EQUIVA 10CC (Bone Implant) ×2 IMPLANT
BRUSH SCRUB EZ PLAIN DRY (MISCELLANEOUS) ×2 IMPLANT
CANISTER SUCTION 2500CC (MISCELLANEOUS) ×2 IMPLANT
CLOTH BEACON ORANGE TIMEOUT ST (SAFETY) ×2 IMPLANT
CONT SPEC 4OZ CLIKSEAL STRL BL (MISCELLANEOUS) ×4 IMPLANT
COVER BACK TABLE 24X17X13 BIG (DRAPES) IMPLANT
COVER TABLE BACK 60X90 (DRAPES) ×2 IMPLANT
DERMABOND ADVANCED (GAUZE/BANDAGES/DRESSINGS) ×1
DERMABOND ADVANCED .7 DNX12 (GAUZE/BANDAGES/DRESSINGS) ×1 IMPLANT
DRAPE C-ARM 42X72 X-RAY (DRAPES) ×6 IMPLANT
DRAPE LAPAROTOMY 100X72X124 (DRAPES) ×2 IMPLANT
DRAPE SURG 17X23 STRL (DRAPES) ×4 IMPLANT
DRESSING TELFA 8X3 (GAUZE/BANDAGES/DRESSINGS) ×2 IMPLANT
ELECT REM PT RETURN 9FT ADLT (ELECTROSURGICAL) ×2
ELECTRODE REM PT RTRN 9FT ADLT (ELECTROSURGICAL) ×1 IMPLANT
EVACUATOR 1/8 PVC DRAIN (DRAIN) ×2 IMPLANT
GAUZE SPONGE 4X4 16PLY XRAY LF (GAUZE/BANDAGES/DRESSINGS) ×2 IMPLANT
GLOVE BIO SURGEON STRL SZ8.5 (GLOVE) ×2 IMPLANT
GLOVE BIOGEL PI IND STRL 7.5 (GLOVE) ×1 IMPLANT
GLOVE BIOGEL PI IND STRL 8 (GLOVE) ×1 IMPLANT
GLOVE BIOGEL PI INDICATOR 7.5 (GLOVE) ×1
GLOVE BIOGEL PI INDICATOR 8 (GLOVE) ×1
GLOVE ECLIPSE 7.5 STRL STRAW (GLOVE) ×12 IMPLANT
GLOVE EXAM NITRILE LRG STRL (GLOVE) IMPLANT
GLOVE EXAM NITRILE MD LF STRL (GLOVE) ×4 IMPLANT
GLOVE EXAM NITRILE XS STR PU (GLOVE) IMPLANT
GLOVE OPTIFIT SS 8.0 STRL (GLOVE) ×2 IMPLANT
GOWN BRE IMP SLV AUR LG STRL (GOWN DISPOSABLE) ×6 IMPLANT
GOWN BRE IMP SLV AUR XL STRL (GOWN DISPOSABLE) ×2 IMPLANT
GOWN STRL REIN 2XL LVL4 (GOWN DISPOSABLE) ×2 IMPLANT
KIT BASIN OR (CUSTOM PROCEDURE TRAY) ×2 IMPLANT
KIT ROOM TURNOVER OR (KITS) ×2 IMPLANT
LUCENT ST 10X22X9 (Cage) ×4 IMPLANT
NEEDLE HYPO 22GX1.5 SAFETY (NEEDLE) ×2 IMPLANT
NS IRRIG 1000ML POUR BTL (IV SOLUTION) ×2 IMPLANT
PACK LAMINECTOMY NEURO (CUSTOM PROCEDURE TRAY) ×2 IMPLANT
PAD ARMBOARD 7.5X6 YLW CONV (MISCELLANEOUS) ×8 IMPLANT
PATTIES SURGICAL .75X.75 (GAUZE/BANDAGES/DRESSINGS) ×2 IMPLANT
ROD PREBENT 45MM (Rod) ×4 IMPLANT
SCREW POLY 6.5X40MM (Screw) ×8 IMPLANT
SPONGE GAUZE 4X4 12PLY (GAUZE/BANDAGES/DRESSINGS) ×2 IMPLANT
SPONGE LAP 4X18 X RAY DECT (DISPOSABLE) IMPLANT
SPONGE SURGIFOAM ABS GEL 100 (HEMOSTASIS) ×2 IMPLANT
STAPLER VISISTAT 35W (STAPLE) ×2 IMPLANT
STRIP CLOSURE SKIN 1/2X4 (GAUZE/BANDAGES/DRESSINGS) ×2 IMPLANT
SUT VIC AB 0 CT1 18XCR BRD8 (SUTURE) ×1 IMPLANT
SUT VIC AB 0 CT1 8-18 (SUTURE) ×1
SUT VIC AB 2-0 OS6 18 (SUTURE) ×6 IMPLANT
SUT VIC AB 3-0 CP2 18 (SUTURE) ×2 IMPLANT
SYR 20ML ECCENTRIC (SYRINGE) ×2 IMPLANT
TAPE CLOTH 4X10 WHT NS (GAUZE/BANDAGES/DRESSINGS) ×2 IMPLANT
TOOL FLUTED BALL 7MM (MISCELLANEOUS) ×2 IMPLANT
TOOL MATCHSTK 3MM (MISCELLANEOUS) ×2 IMPLANT
TOP CLSR SEQUOIA (Orthopedic Implant) ×8 IMPLANT
TOWEL OR 17X24 6PK STRL BLUE (TOWEL DISPOSABLE) ×2 IMPLANT
TOWEL OR 17X26 10 PK STRL BLUE (TOWEL DISPOSABLE) ×2 IMPLANT
TRAP SPECIMEN MUCOUS 40CC (MISCELLANEOUS) ×2 IMPLANT
TRAY FOLEY CATH 14FRSI W/METER (CATHETERS) ×2 IMPLANT
WATER STERILE IRR 1000ML POUR (IV SOLUTION) ×2 IMPLANT

## 2012-05-23 NOTE — Op Note (Signed)
Preop diagnosis: Herniated disc L4-5 with spinal stenosis with L4 and L5 nerve root compression Postop diagnosis: Same Procedure: L4-5 bilateral decompressive laminectomy with decompression of L4 and L5 nerve roots more so than needed for interbody fusion followed by L4-5 bilateral microdiscectomy followed by L4-5 posterior lumbar interbody fusion with peek interbody spacer followed by nonsegmental instrumentation L4-5 with Sequoia pedicle screw instrumentation followed by L4-5 posterolateral fusion Surgeon: Aysel Gilchrest Assistant: Lovell Sheehan  After being placed the prone position the patient's back was prepped and draped in the usual sterile fashion. Localizing fluoroscopy was used prior to incision to identify the appropriate level. Midline incision was made above the spinous processes of L3-L4 and L5. Using Bovie cutting current the incision was carried on the spinous processes. Subperiosteal dissection was then carried out bilaterally on the spinous processes lamina facet joint and to the far lateral region to identify the transverse processes of L4 and L5. Self-retaining tract was placed for exposure and x-ray showed approach the appropriate level. Using the Leksell rongeur spinous process of L4 and L5 were removed. The patient's left side generous laminotomy was performed removing the inferior two thirds of the L4 lamina the medial three quarters of the facet joint the superior one half of the L5 lamina. Residual bone was removed and saved for use later in the case ligamentum flavum was removed as well. L4 and L5 roots were decompressed. Similar decompression was then carried on the opposite side once again removing the lamina facet joint and ligamentum flavum. With this completed remove the residual midline structures to complete the bilateral decompression. L4 and L5 dorsal visualized well decompressed bilaterally more so than needed for interbody fusion. The disc space was then entered and thoroughly cleaned  out with pituitary rongeurs and curettes. It was then prepared for interbody fusion. We distracted up to a 9 mm size and felt this was a good choice. We then prepared the endplates and interspace for interbody fusion and placed a 9 mm cage filled with morselized allograft and autologous bone. Prior to placing the second cage and the opposite side we mixed the same mixture the midline to help with interbody fusion. Cages were followed in excellent position. We then placed pedicle screw bilaterally at L4 and L5. We used high-speed drill to make a small entry point then passed the pedicle all the pedicle from lateral to medial direction. We tapped with a 5.5 mm tap and placed 6.5 x 40 mm screws at L4 and L5 bilaterally and followed these into good position. We then decorticated the far lateral region placed a mixture of autologous bone morselized allograft for a posterior lateral fusion. We then chose appropriate length rod and secured to the top of the screws and did final tightening with torque and counter torque and compression of L4 and L5. Final fluoroscopy looked excellent with good position of the interbody cages screws and rods. Irrigation was carried out and any bleeding control proper coagulation Gelfoam. An epidural drain was left in the epidural space and brought out through a separate stab incision. The was then closed in multiple layers of Vicryl on the muscle fascia subcutaneous and subcuticular tissues. Dermabond Steri-Strips were placed on the skin. Shortness was then applied the patient extubated covered stable condition.

## 2012-05-23 NOTE — Transfer of Care (Signed)
Immediate Anesthesia Transfer of Care Note  Patient: Erica Herrera  Procedure(s) Performed: Procedure(s) (LRB) with comments: POSTERIOR LUMBAR FUSION 1 LEVEL (N/A) - Posterior Lumbar Four-Five Interbody and Fusion with Pedicle Screws  Patient Location: PACU  Anesthesia Type: General  Level of Consciousness: sedated and patient cooperative  Airway & Oxygen Therapy: Patient Spontanous Breathing and Patient connected to face mask oxygen  Post-op Assessment: Report given to PACU RN, Post -op Vital signs reviewed and stable and Patient moving all extremities X 4  Post vital signs: Reviewed and stable  Complications: No apparent anesthesia complications
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
## 2012-05-23 NOTE — Anesthesia Postprocedure Evaluation (Signed)
Anesthesia Post Note  Patient: Erica Herrera  Procedure(s) Performed: Procedure(s) (LRB): POSTERIOR LUMBAR FUSION 1 LEVEL (N/A)  Anesthesia type: general  Patient location: PACU  Post pain: Pain level controlled  Post assessment: Patient's Cardiovascular Status Stable  Last Vitals:  Filed Vitals:   05/23/12 1400  BP:   Pulse: 76  Temp:   Resp: 18    Post vital signs: Reviewed and stable  Level of consciousness: sedated  Complications: No apparent anesthesia complications

## 2012-05-23 NOTE — Anesthesia Preprocedure Evaluation (Addendum)
Anesthesia Evaluation  Patient identified by MRN, date of birth, ID band Patient awake    Reviewed: Allergy & Precautions, H&P , NPO status , Patient's Chart, lab work & pertinent test results  History of Anesthesia Complications Negative for: history of anesthetic complications  Airway Mallampati: I TM Distance: >3 FB Neck ROM: Full    Dental  (+) Dental Advisory Given, Edentulous Upper and Edentulous Lower   Pulmonary asthma , Current Smoker,    Pulmonary exam normal       Cardiovascular     Neuro/Psych PSYCHIATRIC DISORDERS Anxiety Depression negative neurological ROS     GI/Hepatic negative GI ROS, Neg liver ROS,   Endo/Other  diabetes, Type 2, Oral Hypoglycemic AgentsDM 2 dx 2004  Renal/GU negative Renal ROS     Musculoskeletal   Abdominal   Peds  Hematology   Anesthesia Other Findings   Reproductive/Obstetrics                         Anesthesia Physical Anesthesia Plan  ASA: II  Anesthesia Plan: General   Post-op Pain Management:    Induction: Intravenous  Airway Management Planned: Oral ETT  Additional Equipment:   Intra-op Plan:   Post-operative Plan: Extubation in OR  Informed Consent: I have reviewed the patients History and Physical, chart, labs and discussed the procedure including the risks, benefits and alternatives for the proposed anesthesia with the patient or authorized representative who has indicated his/her understanding and acceptance.   Dental advisory given  Plan Discussed with: CRNA, Anesthesiologist and Surgeon  Anesthesia Plan Comments:         Anesthesia Quick Evaluation

## 2012-05-23 NOTE — Progress Notes (Signed)
Utilization Review Completed.Erica Herrera T10/10/2011

## 2012-05-24 LAB — GLUCOSE, CAPILLARY: Glucose-Capillary: 165 mg/dL — ABNORMAL HIGH (ref 70–99)

## 2012-05-24 MED ORDER — OXYCODONE-ACETAMINOPHEN 5-325 MG PO TABS: 1.0000 | ORAL_TABLET | ORAL | Status: DC | PRN

## 2012-05-24 NOTE — Progress Notes (Signed)
Orthopedic Tech Progress Note Patient Details:  Erica Herrera [birth year] [id number]  Patient ID: Erica Herrera, female   DOB: 1966-05-15, 47 y.o.   MRN: [id number] Brace order completed by Storm Frisk, Nate Perri 05/24/2012, 5:34 PM

## 2012-05-24 NOTE — Plan of Care (Signed)
Problem: Consults Goal: Diagnosis - Spinal Surgery Outcome: Completed/Met Date Met:  05/24/12 Lumbar Laminectomy (Complex)

## 2012-05-24 NOTE — Discharge Summary (Signed)
Physician Discharge Summary  Patient ID: Erica Herrera MRN: [id number] DOB/AGE: [birth year] 46 y.o.  Admit date: 05/23/2012 Discharge date: 05/24/2012  Admission Diagnoses:  Discharge Diagnoses:  Active Problems:  * No active hospital problems. *    Discharged Condition: good  Hospital Course: Surgery one day with 1 level plif. Did well. By next day, pain free, ambulating well. Wound looks fine. Home today, specific instructions given.  Consults: None  Significant Diagnostic Studies: none  Treatments: surgery: L45 plif  Discharge Exam: Blood pressure 120/63, pulse 67, temperature 98.4 F (36.9 C), temperature source Oral, resp. rate 18, height 5\' 8"  (1.727 m), weight 84.052 kg (185 lb 4.8 oz), SpO2 98.00%. Incision/Wound:healing well  Disposition: Final discharge disposition not confirmed  Discharge Orders    Future Orders Please Complete By Expires   Diet general      Discharge instructions      Comments:   Mostly bedrest. Get up 9 or 10 times each day and walk for 15-20 minutes each time. Very little sitting the first week. No riding in the car until your first post op appointment. If you had neck surgery...may shower from the chest down. If you had low back surgery....you may shower with a saran wrap covering over the incision. Take your pain medicine as needed...and other medicines that you are instructed to take. Call for an appointment...[phone].   Call MD for:  temperature >100.4      Call MD for:  persistant nausea and vomiting      Call MD for:  severe uncontrolled pain      Call MD for:  redness, tenderness, or signs of infection (pain, swelling, redness, odor or green/yellow discharge around incision site)      Call MD for:  difficulty breathing, headache or visual disturbances      Call MD for:  hives          Medication List     As of 05/24/2012 12:50 PM    STOP taking these medications         naproxen 500 MG tablet   Commonly known as: NAPROSYN      TAKE these medications         albuterol 108 (90 BASE) MCG/ACT inhaler   Commonly known as: PROVENTIL HFA;VENTOLIN HFA   Inhale 2 puffs into the lungs every 6 (six) hours as needed for wheezing.      carisoprodol 350 MG tablet   Commonly known as: SOMA   Take 350 mg by mouth 3 (three) times daily as needed. For pain      lamoTRIgine 150 MG tablet   Commonly known as: LAMICTAL   Take 1 tablet (150 mg total) by mouth 2 (two) times daily.      metFORMIN 500 MG 24 hr tablet   Commonly known as: GLUCOPHAGE-XR   Take 1 tablet (500 mg total) by mouth daily with breakfast.      multivitamin tablet   Take 1 tablet by mouth daily.      oxyCODONE-acetaminophen 5-325 MG per tablet   Commonly known as: PERCOCET/ROXICET   Take 1-2 tablets by mouth every 4 (four) hours as needed.      oxyCODONE-acetaminophen 5-325 MG per tablet   Commonly known as: PERCOCET/ROXICET   Take 1 tablet by mouth every 6 (six) hours as needed. For pain      QUEtiapine 25 MG tablet   Commonly known as: SEROQUEL   Take 25 mg by mouth 2 (two) times daily as needed.  For anxiety      sertraline 50 MG tablet   Commonly known as: ZOLOFT   75 mg. Take 75mg  daily         At home rest most of the time. Get up 9 or 10 times each day and take a 15 or 20 minute walk. No riding in the car and to your first postoperative appointment. If you have neck surgery you may shower from the chest down starting on the third postoperative day. If you had back surgery he may start showering on the third postoperative day with saran wrap wrapped around your incisional area 3 times. After the shower remove the saran wrap. Take pain medicine as needed and other medications as instructed. Call my office for an appointment.  SignedReinaldo Meeker, MD 05/24/2012, 12:50 PM

## 2012-05-24 NOTE — Progress Notes (Signed)
Orthopedic Tech Progress Note Patient Details:  Erica Herrera May 16, 1966 [id number] Order for Lumbar Corset called in to [employer] at 12:55pm. Order taken by [facility]. Ortho Devices Type of Ortho Device: Lumbar corsett Ortho Device/Splint Interventions: Ordered   [place] R Thompson 05/24/2012, 1:00 PM

## 2012-05-26 NOTE — Progress Notes (Signed)
Orthopedic Tech Progress Note Patient Details:  Michalia Troxler 1965-11-24 [id number]  Patient ID: Amalia Greenhouse, female   DOB: 16-Aug-1966, 46 y.o.   MRN: [id number]   Shawnie Pons 05/26/2012, 10:12 AM LS SUPPORT WITH ANT. AND POST. PANELS COMPLETED BY [employer]

## 2012-05-27 ENCOUNTER — Ambulatory Visit: Payer: Medicare Other | Admitting: Family Medicine

## 2012-07-01 DIAGNOSIS — M5126 Other intervertebral disc displacement, lumbar region: Secondary | ICD-10-CM | POA: Diagnosis not present

## 2012-07-02 DIAGNOSIS — E119 Type 2 diabetes mellitus without complications: Secondary | ICD-10-CM | POA: Diagnosis not present

## 2012-07-15 DIAGNOSIS — Z5320 Procedure and treatment not carried out because of patient's decision for unspecified reasons: Secondary | ICD-10-CM | POA: Diagnosis not present

## 2012-07-15 DIAGNOSIS — M25569 Pain in unspecified knee: Secondary | ICD-10-CM | POA: Diagnosis not present

## 2012-07-16 DIAGNOSIS — Z79899 Other long term (current) drug therapy: Secondary | ICD-10-CM | POA: Diagnosis not present

## 2012-07-16 DIAGNOSIS — F17200 Nicotine dependence, unspecified, uncomplicated: Secondary | ICD-10-CM | POA: Diagnosis not present

## 2012-07-16 DIAGNOSIS — S6990XA Unspecified injury of unspecified wrist, hand and finger(s), initial encounter: Secondary | ICD-10-CM | POA: Diagnosis not present

## 2012-07-16 DIAGNOSIS — IMO0002 Reserved for concepts with insufficient information to code with codable children: Secondary | ICD-10-CM | POA: Diagnosis not present

## 2012-07-16 DIAGNOSIS — E119 Type 2 diabetes mellitus without complications: Secondary | ICD-10-CM | POA: Diagnosis not present

## 2012-07-16 DIAGNOSIS — M79609 Pain in unspecified limb: Secondary | ICD-10-CM | POA: Diagnosis not present

## 2012-07-31 DIAGNOSIS — M5126 Other intervertebral disc displacement, lumbar region: Secondary | ICD-10-CM | POA: Diagnosis not present

## 2012-08-01 ENCOUNTER — Encounter: Payer: Self-pay | Admitting: Family Medicine

## 2012-08-01 ENCOUNTER — Ambulatory Visit (INDEPENDENT_AMBULATORY_CARE_PROVIDER_SITE_OTHER): Payer: Medicare Other | Admitting: Family Medicine

## 2012-08-01 ENCOUNTER — Telehealth: Payer: Self-pay | Admitting: Family Medicine

## 2012-08-01 ENCOUNTER — Ambulatory Visit (HOSPITAL_COMMUNITY)
Admission: RE | Admit: 2012-08-01 | Discharge: 2012-08-01 | Disposition: A | Discharge status: Home / Self Care | Payer: Medicare Other | Source: Ambulatory Visit | Attending: Family Medicine | Admitting: Family Medicine

## 2012-08-01 VITALS — BP 104/70 | HR 93 | Resp 15 | Ht 67.0 in | Wt 186.0 lb

## 2012-08-01 DIAGNOSIS — M25519 Pain in unspecified shoulder: Secondary | ICD-10-CM | POA: Diagnosis not present

## 2012-08-01 DIAGNOSIS — F319 Bipolar disorder, unspecified: Secondary | ICD-10-CM | POA: Diagnosis not present

## 2012-08-01 DIAGNOSIS — W19XXXA Unspecified fall, initial encounter: Secondary | ICD-10-CM | POA: Insufficient documentation

## 2012-08-01 DIAGNOSIS — E785 Hyperlipidemia, unspecified: Secondary | ICD-10-CM | POA: Diagnosis not present

## 2012-08-01 DIAGNOSIS — S4980XA Other specified injuries of shoulder and upper arm, unspecified arm, initial encounter: Secondary | ICD-10-CM | POA: Diagnosis not present

## 2012-08-01 DIAGNOSIS — S46909A Unspecified injury of unspecified muscle, fascia and tendon at shoulder and upper arm level, unspecified arm, initial encounter: Secondary | ICD-10-CM | POA: Diagnosis not present

## 2012-08-01 DIAGNOSIS — Z720 Tobacco use: Secondary | ICD-10-CM

## 2012-08-01 DIAGNOSIS — M542 Cervicalgia: Secondary | ICD-10-CM | POA: Diagnosis not present

## 2012-08-01 DIAGNOSIS — M19019 Primary osteoarthritis, unspecified shoulder: Secondary | ICD-10-CM | POA: Diagnosis not present

## 2012-08-01 DIAGNOSIS — M5030 Other cervical disc degeneration, unspecified cervical region: Secondary | ICD-10-CM | POA: Insufficient documentation

## 2012-08-01 DIAGNOSIS — M549 Dorsalgia, unspecified: Secondary | ICD-10-CM

## 2012-08-01 DIAGNOSIS — F17200 Nicotine dependence, unspecified, uncomplicated: Secondary | ICD-10-CM

## 2012-08-01 DIAGNOSIS — S199XXA Unspecified injury of neck, initial encounter: Secondary | ICD-10-CM | POA: Diagnosis not present

## 2012-08-01 DIAGNOSIS — E119 Type 2 diabetes mellitus without complications: Secondary | ICD-10-CM | POA: Diagnosis not present

## 2012-08-01 DIAGNOSIS — G8929 Other chronic pain: Secondary | ICD-10-CM

## 2012-08-01 LAB — BASIC METABOLIC PANEL
BUN: 9 mg/dL (ref 6–23)
CO2: 26 mEq/L (ref 19–32)
Chloride: 100 mEq/L (ref 96–112)
Glucose, Bld: 189 mg/dL — ABNORMAL HIGH (ref 70–99)
Potassium: 5 mEq/L (ref 3.5–5.3)

## 2012-08-01 LAB — LIPID PANEL
Cholesterol: 202 mg/dL — ABNORMAL HIGH (ref 0–200)
LDL Cholesterol: 96 mg/dL (ref 0–99)
VLDL: 71 mg/dL — ABNORMAL HIGH (ref 0–40)

## 2012-08-01 MED ORDER — SERTRALINE HCL 50 MG PO TABS: ORAL_TABLET | ORAL | Status: DC

## 2012-08-01 MED ORDER — QUETIAPINE FUMARATE 25 MG PO TABS: 25.0000 mg | ORAL_TABLET | Freq: Two times a day (BID) | ORAL | Status: DC | PRN

## 2012-08-01 MED ORDER — MELOXICAM 7.5 MG PO TABS: 7.5000 mg | ORAL_TABLET | Freq: Every day | ORAL | Status: DC

## 2012-08-01 MED ORDER — LAMOTRIGINE 150 MG PO TABS: 150.0000 mg | ORAL_TABLET | Freq: Two times a day (BID) | ORAL | Status: DC

## 2012-08-01 MED ORDER — OXYCODONE-ACETAMINOPHEN 5-325 MG PO TABS: 1.0000 | ORAL_TABLET | Freq: Four times a day (QID) | ORAL | Status: DC | PRN

## 2012-08-01 MED ORDER — CARISOPRODOL 350 MG PO TABS: 350.0000 mg | ORAL_TABLET | Freq: Three times a day (TID) | ORAL | Status: DC | PRN

## 2012-08-01 NOTE — Assessment & Plan Note (Signed)
Improved s/p surgery, on pain contract

## 2012-08-01 NOTE — Assessment & Plan Note (Signed)
Degenerative changes at [facility] joint, start NSAID

## 2012-08-01 NOTE — Assessment & Plan Note (Signed)
No longer seeing therapist, missed appt with psychiatrist due to surgery recovery, she was upset she was placed on some sort of home therapy/monitoring against her will, would like new therapist Advised her she needs to be under care of psychiatrist, Meds refilled for now

## 2012-08-01 NOTE — Telephone Encounter (Signed)
Patient is aware 

## 2012-08-01 NOTE — Assessment & Plan Note (Signed)
Unchanged, counseled on cessation

## 2012-08-01 NOTE — Patient Instructions (Addendum)
Get the labs done today  Get the xray of your neck /shoulder Start the inflammatory medication New referral to psychiatry  Restart pain medications  We will call with results and instructions  F/u in 4 weeks if not improved otherwise 3 months

## 2012-08-01 NOTE — Assessment & Plan Note (Signed)
deterioated with fall, xray neck shows DDD and intact fusion Restart SOMA, refilled pain meds on pain contract If no improvement consider CT neck

## 2012-08-01 NOTE — Progress Notes (Addendum)
  Subjective:    Patient ID: Erica Herrera, female    DOB: 02-09-1966, 46 y.o.   MRN: [id number]  HPI  Pt here s/p fall 2 weeks ago, tripped off sidewalk landed on left shoulder wrist, 3 days later began to have neck and shoulder pain radiating to arm, continues to hav ewrist pain, evaluated in [facility], negative xray wrist. Taking ultram which has not helped DM- not taking metformin states unable to afford Chronic back pain- s/p surgery in Oct, no longer in braceing, no current pain meds   Review of Systems  GEN- denies fatigue, fever, weight loss,weakness, recent illness HEENT- denies eye drainage, change in vision, nasal discharge, CVS- denies chest pain, palpitations RESP- denies SOB, cough, wheeze ABD- denies N/V, change in stools, abd pain GU- denies dysuria, hematuria, dribbling, incontinence MSK- +joint pain, muscle aches, injury Neuro- denies headache, dizziness, syncope, seizure activity      Objective:   Physical Exam GEN- NAD, alert and oriented x3 HEENT- PERRL, EOMI, non injected sclera, pink conjunctiva, MMM, oropharynx clear Neck- Supple, decreased ROM, spasm  CVS- RRR, no murmur RESP-CTAB EXT- No edema Pulses- Radial, DP- 2+ Neuro- CNII-XII in tact, normal tone, DTR symmetric UE, sensation grossly in tact MSK- Wrist, TTP , good ROM, shoulder rotator cuff in tact, TTP along top of shoulder and deltoid, biceps in tact DM- normal monofilament exam, callus and bunion       Assessment & Plan:

## 2012-08-01 NOTE — Assessment & Plan Note (Signed)
Recheck labs, pt never started medication, will evaluate labs then decide on plan

## 2012-08-02 MED ORDER — METFORMIN HCL ER 500 MG PO TB24: 500.0000 mg | ORAL_TABLET | Freq: Every day | ORAL | Status: DC

## 2012-08-02 NOTE — Addendum Note (Signed)
Addended by: Milinda Antis F on: 08/02/2012 09:10 PM   Modules accepted: Orders

## 2012-08-06 ENCOUNTER — Encounter: Payer: Self-pay | Admitting: Family Medicine

## 2012-08-06 NOTE — Telephone Encounter (Signed)
Please advise 

## 2012-08-11 ENCOUNTER — Encounter: Payer: Self-pay | Admitting: Family Medicine

## 2012-08-12 ENCOUNTER — Other Ambulatory Visit: Payer: Self-pay | Admitting: Family Medicine

## 2012-08-12 MED ORDER — NAPROXEN 500 MG PO TABS: 500.0000 mg | ORAL_TABLET | Freq: Two times a day (BID) | ORAL | Status: AC

## 2012-08-19 ENCOUNTER — Other Ambulatory Visit: Payer: Self-pay | Admitting: Family Medicine

## 2012-08-19 ENCOUNTER — Encounter: Payer: Self-pay | Admitting: Family Medicine

## 2012-08-19 MED ORDER — PROMETHAZINE HCL 12.5 MG PO TABS: 12.5000 mg | ORAL_TABLET | Freq: Four times a day (QID) | ORAL | Status: DC | PRN

## 2012-08-23 ENCOUNTER — Other Ambulatory Visit: Payer: Self-pay

## 2012-08-23 MED ORDER — OXYCODONE-ACETAMINOPHEN 5-325 MG PO TABS: 1.0000 | ORAL_TABLET | Freq: Four times a day (QID) | ORAL | Status: DC | PRN

## 2012-09-04 DIAGNOSIS — M5126 Other intervertebral disc displacement, lumbar region: Secondary | ICD-10-CM | POA: Diagnosis not present

## 2012-09-06 ENCOUNTER — Ambulatory Visit (INDEPENDENT_AMBULATORY_CARE_PROVIDER_SITE_OTHER): Payer: Medicare Other | Admitting: Family Medicine

## 2012-09-06 ENCOUNTER — Encounter: Payer: Self-pay | Admitting: Family Medicine

## 2012-09-06 VITALS — BP 106/68 | HR 82 | Resp 18 | Ht 67.0 in | Wt 184.1 lb

## 2012-09-06 DIAGNOSIS — M549 Dorsalgia, unspecified: Secondary | ICD-10-CM

## 2012-09-06 DIAGNOSIS — F319 Bipolar disorder, unspecified: Secondary | ICD-10-CM

## 2012-09-06 DIAGNOSIS — Z720 Tobacco use: Secondary | ICD-10-CM

## 2012-09-06 DIAGNOSIS — Z79899 Other long term (current) drug therapy: Secondary | ICD-10-CM | POA: Diagnosis not present

## 2012-09-06 DIAGNOSIS — G8929 Other chronic pain: Secondary | ICD-10-CM

## 2012-09-06 DIAGNOSIS — E785 Hyperlipidemia, unspecified: Secondary | ICD-10-CM | POA: Diagnosis not present

## 2012-09-06 DIAGNOSIS — E119 Type 2 diabetes mellitus without complications: Secondary | ICD-10-CM

## 2012-09-06 DIAGNOSIS — M542 Cervicalgia: Secondary | ICD-10-CM

## 2012-09-06 DIAGNOSIS — R109 Unspecified abdominal pain: Secondary | ICD-10-CM

## 2012-09-06 DIAGNOSIS — K5289 Other specified noninfective gastroenteritis and colitis: Secondary | ICD-10-CM

## 2012-09-06 DIAGNOSIS — K529 Noninfective gastroenteritis and colitis, unspecified: Secondary | ICD-10-CM

## 2012-09-06 DIAGNOSIS — F17200 Nicotine dependence, unspecified, uncomplicated: Secondary | ICD-10-CM

## 2012-09-06 MED ORDER — BUPROPION HCL ER (SR) 150 MG PO TB12: 150.0000 mg | ORAL_TABLET | Freq: Two times a day (BID) | ORAL | Status: DC

## 2012-09-06 MED ORDER — OMEGA-3-ACID ETHYL ESTERS 1 G PO CAPS: 1.0000 g | ORAL_CAPSULE | Freq: Two times a day (BID) | ORAL | Status: DC

## 2012-09-06 MED ORDER — METHYLPREDNISOLONE 4 MG PO KIT: PACK | ORAL | Status: DC

## 2012-09-06 NOTE — Patient Instructions (Addendum)
You'll be referred to pain management Start Medrol Dosepak for inflammation in the shoulder and back Percocet refill Start Wellbutrin for the smoking cessation take one tablet daily for 3 days then increase to one tablet twice a day Start the low positive twice a day for high triglyceride Pick at the metformin for your diabetes mellitus Referral to GI Followup in 3 months

## 2012-09-09 ENCOUNTER — Encounter: Payer: Self-pay | Admitting: Family Medicine

## 2012-09-09 ENCOUNTER — Ambulatory Visit: Payer: Medicare Other | Admitting: Family Medicine

## 2012-09-09 DIAGNOSIS — E782 Mixed hyperlipidemia: Secondary | ICD-10-CM | POA: Insufficient documentation

## 2012-09-09 DIAGNOSIS — G8929 Other chronic pain: Secondary | ICD-10-CM | POA: Insufficient documentation

## 2012-09-09 NOTE — Assessment & Plan Note (Signed)
She has been released from neurosurgery care, will try to establish with pain management

## 2012-09-09 NOTE — Assessment & Plan Note (Signed)
Trial of wellbutrin, discussed cessation

## 2012-09-09 NOTE — Assessment & Plan Note (Signed)
Unfortunately I have no records to evaluate regarding the colitis history, she has never had colonoscopy or biopsy, benign exam but has been symptomatic, refer to GI? Scope needed at this time

## 2012-09-09 NOTE — Progress Notes (Signed)
  Subjective:    Patient ID: Erica Herrera, female    DOB: 1966/04/21, 47 y.o.   MRN: [id number]  HPI  Pt here to f/u chronic pain and abdominal pain. Chronic back pain, feels pain at her "SI joint", pain radiates down bilat buttocks. Continue neck pain and stiffness, left wrist continues to have pain, out of pain meds Colitis states she forgot to tell me about her history of colitis, at age 56 began have abdominal pain with bloody stools and mucous, never had colonoscopy , past few weeks has been having abdominal pain with "currant jelly stool" per patient, no fever, no N/V DM- has not started metformin, states unable to afford Hypertriglyceridemia- unable to afford fish oil out of pocket Psych- has not called [facility], refuses to go back to [facility]  Review of Systems  GEN- denies fatigue, fever, weight loss,weakness, recent illness HEENT- denies eye drainage, change in vision, nasal discharge, CVS- denies chest pain, palpitations RESP- denies SOB, cough, wheeze ABD- denies N/V, change in stools, +abd pain GU- denies dysuria, hematuria, dribbling, incontinence MSK-+ joint pain, muscle aches, injury Neuro- denies headache, dizziness, syncope, seizure activity      Objective:   Physical Exam GEN- NAD, alert and oriented x3 HEENT- PERRL, EOMI, non injected sclera, pink conjunctiva, MMM, oropharynx clear Neck- Supple, decreased ROM,no spasm CVS- RRR, no murmur RESP-CTAB ABD-NABS,soft,NT,ND EXT- No edema Pulses- Radial, DP- 2+ MSK- Back- TTP directly at [facility] joint, and lumbar region, neg SLR. Left wrist, no swelling normal ROM         Assessment & Plan:

## 2012-09-09 NOTE — Assessment & Plan Note (Signed)
Discussed importance of meds, she is able to get metformin now, will need ACEI in future

## 2012-09-09 NOTE — Assessment & Plan Note (Signed)
Medrol dose pak, chronic pain meds, referral to Neuroogy S/p remote spinal fusion

## 2012-09-09 NOTE — Assessment & Plan Note (Signed)
Advised I will not continue to prescribe her medications if she does not follow through with her appointment

## 2012-09-09 NOTE — Assessment & Plan Note (Signed)
lovaza twice a day

## 2012-09-13 ENCOUNTER — Telehealth: Payer: Self-pay | Admitting: Family Medicine

## 2012-09-13 NOTE — Telephone Encounter (Signed)
Patient is aware 

## 2012-09-20 ENCOUNTER — Telehealth: Payer: Self-pay | Admitting: Family Medicine

## 2012-09-20 DIAGNOSIS — M5030 Other cervical disc degeneration, unspecified cervical region: Secondary | ICD-10-CM

## 2012-09-20 DIAGNOSIS — Z981 Arthrodesis status: Secondary | ICD-10-CM

## 2012-09-20 DIAGNOSIS — G8929 Other chronic pain: Secondary | ICD-10-CM

## 2012-09-20 NOTE — Telephone Encounter (Signed)
She will be referred back to neurosurgery, please let her know

## 2012-09-21 ENCOUNTER — Encounter: Payer: Self-pay | Admitting: Family Medicine

## 2012-09-23 ENCOUNTER — Telehealth: Payer: Self-pay | Admitting: Family Medicine

## 2012-09-23 MED ORDER — ALBUTEROL SULFATE HFA 108 (90 BASE) MCG/ACT IN AERS: 2.0000 | INHALATION_SPRAY | RESPIRATORY_TRACT | Status: DC | PRN

## 2012-09-23 NOTE — Telephone Encounter (Signed)
See chart note.

## 2012-10-07 ENCOUNTER — Telehealth: Payer: Self-pay | Admitting: Family Medicine

## 2012-10-07 MED ORDER — OXYCODONE-ACETAMINOPHEN 5-325 MG PO TABS: 1.0000 | ORAL_TABLET | Freq: Four times a day (QID) | ORAL | Status: DC | PRN

## 2012-10-07 NOTE — Telephone Encounter (Signed)
Medicine printed for Dr to sign, Has to be collected in the office

## 2012-10-08 ENCOUNTER — Encounter: Payer: Self-pay | Admitting: Urgent Care

## 2012-10-08 ENCOUNTER — Ambulatory Visit (INDEPENDENT_AMBULATORY_CARE_PROVIDER_SITE_OTHER): Payer: Medicaid Other | Admitting: Urgent Care

## 2012-10-08 VITALS — BP 147/75 | HR 107 | Temp 97.8°F | Ht 67.0 in | Wt 191.2 lb

## 2012-10-08 DIAGNOSIS — K921 Melena: Secondary | ICD-10-CM | POA: Insufficient documentation

## 2012-10-08 DIAGNOSIS — R198 Other specified symptoms and signs involving the digestive system and abdomen: Secondary | ICD-10-CM | POA: Diagnosis not present

## 2012-10-08 DIAGNOSIS — R194 Change in bowel habit: Secondary | ICD-10-CM

## 2012-10-08 MED ORDER — PEG 3350-KCL-NA BICARB-NACL 420 G PO SOLR: 4000.0000 mL | ORAL | Status: DC

## 2012-10-08 MED ORDER — HYOSCYAMINE SULFATE 0.125 MG SL SUBL: 0.1250 mg | SUBLINGUAL_TABLET | Freq: Three times a day (TID) | SUBLINGUAL | Status: DC

## 2012-10-08 NOTE — Patient Instructions (Addendum)
Colonoscopy & EGD (Upper Endoscopy) with Dr Derl Barrow 0.125mg  30 minutes before meals/bedtime for diarrhea/cramps
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
## 2012-10-08 NOTE — Assessment & Plan Note (Addendum)
Erica Herrera is a pleasant 46 y.o. female with chronic alternating bowel habits ranging from constipation to diarrhea with significant post-prandial urgency,abdominal pain& nausea.  She has maroon-colored blood in stools.  Ileocolonoscopy to look for inflammatory bowel disease & possible EGD with small bowel biopsy if needed with Dr Jena Gauss.  I have discussed risks & benefits which include, but are not limited to, bleeding, infection, perforation & drug reaction.  The patient agrees with this plan & written consent will be obtained.    Phenergan 25mg  IV 30 minutes prior to procedure to augment sedation given hx of chronic narcotics & anxiolytics Levsin 0.125mg  30 minutes before meals/bedtime for diarrhea/cramps

## 2012-10-09 ENCOUNTER — Telehealth: Payer: Self-pay | Admitting: Family Medicine

## 2012-10-09 ENCOUNTER — Other Ambulatory Visit: Payer: Self-pay | Admitting: Neurosurgery

## 2012-10-09 DIAGNOSIS — M542 Cervicalgia: Secondary | ICD-10-CM

## 2012-10-09 DIAGNOSIS — M5126 Other intervertebral disc displacement, lumbar region: Secondary | ICD-10-CM | POA: Diagnosis not present

## 2012-10-09 NOTE — Telephone Encounter (Signed)
Patient stated that she has this appointment

## 2012-10-09 NOTE — Progress Notes (Signed)
Faxed to PCP

## 2012-10-10 ENCOUNTER — Encounter (HOSPITAL_COMMUNITY): Payer: Self-pay | Admitting: Pharmacy Technician

## 2012-10-16 ENCOUNTER — Encounter (HOSPITAL_COMMUNITY): Admission: RE | Payer: Self-pay | Source: Ambulatory Visit

## 2012-10-16 ENCOUNTER — Ambulatory Visit (HOSPITAL_COMMUNITY): Admission: RE | Admit: 2012-10-16 | Payer: Medicare Other | Source: Ambulatory Visit | Admitting: Internal Medicine

## 2012-10-16 ENCOUNTER — Telehealth: Payer: Self-pay | Admitting: Internal Medicine

## 2012-10-16 SURGERY — COLONOSCOPY WITH ESOPHAGOGASTRODUODENOSCOPY (EGD)
Anesthesia: Moderate Sedation

## 2012-10-16 NOTE — Telephone Encounter (Signed)
Patient missed her procedure this morning she said the Trilyte made her have chills her fingers started tingling and she just was feeling bad and quit her solution and didn't go this morning, please advise if patient was having an allergic reaction or should she try something else and reschedule

## 2012-10-16 NOTE — Telephone Encounter (Signed)
Spoke with pt- she drank half the prep, started having bowel movements. When she started on the second half of the prep is when she noticed that her fingertips started tingling., the tingling went thru her hands, she got bad chills and then a headache. She stopped the prep and ate some chicken noodle soup and then a peanut butter and jelly sandwich and immediately noticed that the tingling went away, then the chills stopped and her headache went away. She felt terrible and called and canceled her procedure. Pt is feeling much better now. She cannot figure out if its the prep or her DM. She is type 2 and her doctor doesn't have her monitoring her blood sugars. She did not check them yesterday while doing her prep. She said she used the mag citrate prep when she was 47 years old and used it again about 12 years ago and didn't have any problems with it.   She wants to know what she should do now.

## 2012-10-17 ENCOUNTER — Ambulatory Visit
Admission: RE | Admit: 2012-10-17 | Discharge: 2012-10-17 | Disposition: A | Discharge status: Home / Self Care | Payer: Medicare Other | Source: Ambulatory Visit | Attending: Neurosurgery | Admitting: Neurosurgery

## 2012-10-17 DIAGNOSIS — M542 Cervicalgia: Secondary | ICD-10-CM

## 2012-10-17 DIAGNOSIS — M5020 Other cervical disc displacement, unspecified cervical region: Secondary | ICD-10-CM | POA: Diagnosis not present

## 2012-10-17 DIAGNOSIS — M47812 Spondylosis without myelopathy or radiculopathy, cervical region: Secondary | ICD-10-CM | POA: Diagnosis not present

## 2012-10-17 NOTE — Telephone Encounter (Signed)
Pt is aware and she will contact Dr. Jeanice Lim about what happened and get her opinion. Pt will call us back to reschedule after she talks with Dr. Jeanice Lim. I informed her that it has to be done within 30 days of her office visit or we will have to bring her in for another office visit per hospital protocol. Pt verbalized understanding.   Will forward this message to Dr. Jeanice Lim for her review.   Benedetto Goad, per KJ pt will need miralax prep for next procedure.

## 2012-10-17 NOTE — Telephone Encounter (Signed)
Appt with her Milinda Antis, MD to discuss DM & decide whether she needs glucose monitor. She will need to have procedure within 30 days of OV or reschedule OV here to set up. I doubt this was allergic reaction, however would use a different prep next time.

## 2012-10-17 NOTE — Telephone Encounter (Signed)
Addendum, she needs to call me with her blood sugars after 1 week of recordings

## 2012-10-17 NOTE — Telephone Encounter (Signed)
Please call pt, she has very low dose metformin, it appears she had some tingling and chills after taking the prep for colonoscopy, not sure if she had anything to eat. Will have her monitor fasting blood sugars, she is to continue taking Metformin for now  Please fax in meter,

## 2012-10-18 ENCOUNTER — Encounter: Payer: Self-pay | Admitting: Family Medicine

## 2012-10-18 NOTE — Telephone Encounter (Signed)
Called and left message for patient to return call.  

## 2012-10-21 NOTE — Telephone Encounter (Signed)
Patient aware and will pick up rx.

## 2012-10-25 ENCOUNTER — Encounter: Payer: Self-pay | Admitting: Family Medicine

## 2012-10-28 ENCOUNTER — Other Ambulatory Visit: Payer: Self-pay | Admitting: Family Medicine

## 2012-10-28 DIAGNOSIS — M5030 Other cervical disc degeneration, unspecified cervical region: Secondary | ICD-10-CM | POA: Diagnosis not present

## 2012-10-28 DIAGNOSIS — M961 Postlaminectomy syndrome, not elsewhere classified: Secondary | ICD-10-CM | POA: Diagnosis not present

## 2012-10-28 DIAGNOSIS — M5137 Other intervertebral disc degeneration, lumbosacral region: Secondary | ICD-10-CM | POA: Diagnosis not present

## 2012-10-28 DIAGNOSIS — M47812 Spondylosis without myelopathy or radiculopathy, cervical region: Secondary | ICD-10-CM | POA: Diagnosis not present

## 2012-10-28 MED ORDER — METFORMIN HCL ER (OSM) 1000 MG PO TB24: 1000.0000 mg | ORAL_TABLET | Freq: Every day | ORAL | Status: DC

## 2012-11-29 DIAGNOSIS — M5126 Other intervertebral disc displacement, lumbar region: Secondary | ICD-10-CM | POA: Diagnosis not present

## 2012-12-05 ENCOUNTER — Encounter: Payer: Self-pay | Admitting: Family Medicine

## 2012-12-05 ENCOUNTER — Ambulatory Visit (INDEPENDENT_AMBULATORY_CARE_PROVIDER_SITE_OTHER): Payer: Medicare Other | Admitting: Family Medicine

## 2012-12-05 VITALS — BP 122/82 | HR 87 | Resp 16 | Wt 187.0 lb

## 2012-12-05 DIAGNOSIS — I10 Essential (primary) hypertension: Secondary | ICD-10-CM | POA: Diagnosis not present

## 2012-12-05 DIAGNOSIS — E785 Hyperlipidemia, unspecified: Secondary | ICD-10-CM

## 2012-12-05 DIAGNOSIS — F319 Bipolar disorder, unspecified: Secondary | ICD-10-CM | POA: Diagnosis not present

## 2012-12-05 DIAGNOSIS — M549 Dorsalgia, unspecified: Secondary | ICD-10-CM

## 2012-12-05 DIAGNOSIS — E119 Type 2 diabetes mellitus without complications: Secondary | ICD-10-CM

## 2012-12-05 DIAGNOSIS — F17200 Nicotine dependence, unspecified, uncomplicated: Secondary | ICD-10-CM

## 2012-12-05 DIAGNOSIS — R42 Dizziness and giddiness: Secondary | ICD-10-CM

## 2012-12-05 DIAGNOSIS — Z720 Tobacco use: Secondary | ICD-10-CM

## 2012-12-05 DIAGNOSIS — G8929 Other chronic pain: Secondary | ICD-10-CM

## 2012-12-05 NOTE — Progress Notes (Signed)
  Subjective:    Patient ID: Erica Herrera, female    DOB: 1966-06-23, 47 y.o.   MRN: [id number]  HPI  Patient here to follow chronic medical problems. She did not bring her blood glucose meter today but states yesterday her blood sugars 130 fasting she's been taking the thousand milligrams of metformin daily. She's been having pain with her SI joint she's been followed by pain clinic she also gets pain down her lower back underneath her scar. Her son has not been filled by the pain clinic and asked me to refill this today. She's currently on Dilaudid.  She states she occasionally gets episodes of vertigo-like symptoms she's had for many years. At one time she was told was an atypical migraine. She's not had any increase in the symptoms but had it last week. Denies any syncope or seizure like activity.   Review of Systems  GEN- denies fatigue, fever, weight loss,weakness, recent illness HEENT- denies eye drainage, change in vision, nasal discharge, CVS- denies chest pain, palpitations RESP- denies SOB, cough, wheeze ABD- denies N/V, change in stools, abd pain GU- denies dysuria, hematuria, dribbling, incontinence MSK- denies joint pain, muscle aches, injury Neuro- denies headache,+ dizziness, syncope, seizure activity      Objective:   Physical Exam GEN- NAD, alert and oriented x3 HEENT- PERRL, EOMI, non injected sclera, pink conjunctiva, MMM, oropharynx clear Neck- Supple, decreased ROM CVS- RRR, no murmur RESP-CTAB ABD-NABS,soft,NT,ND EXT- No edema Pulses- Radial, DP- 2+ Neuro- CNII-XII in tact, no focal deficits, no nystagmus       Assessment & Plan:

## 2012-12-05 NOTE — Patient Instructions (Signed)
Get the labs done fasting Start low dose ACEI Continue fish oil  SOMA to be refilled F/U 3 months

## 2012-12-08 DIAGNOSIS — R42 Dizziness and giddiness: Secondary | ICD-10-CM | POA: Insufficient documentation

## 2012-12-08 NOTE — Assessment & Plan Note (Signed)
She has appt upcoming with [facility]

## 2012-12-08 NOTE — Assessment & Plan Note (Signed)
Repeat fasting labs, on lovaza

## 2012-12-08 NOTE — Assessment & Plan Note (Signed)
Pain clinic, I will refill her SOMA

## 2012-12-08 NOTE — Assessment & Plan Note (Signed)
?   Vague description of these dizzy spells, normal neurological exam, have been present for years yet she brings up today, did not mention in her history. Basic labs to be checked anyway today, check TSH. No further work-up Possible migraine related, she is on muscle relaxants, high dose narcotics, would not add anything else to regimen

## 2012-12-08 NOTE — Assessment & Plan Note (Signed)
Recheck A1C continue Metformin 1 gram daily Will need ACEI, check lytes first then add zestril 2.5mg 

## 2012-12-08 NOTE — Assessment & Plan Note (Signed)
Unchanged, continue to encourage cessation 

## 2012-12-09 MED ORDER — CARISOPRODOL 350 MG PO TABS: 350.0000 mg | ORAL_TABLET | Freq: Three times a day (TID) | ORAL | Status: DC | PRN

## 2012-12-09 NOTE — Addendum Note (Signed)
Addended by: Milinda Antis F on: 12/09/2012 08:01 AM   Modules accepted: Orders

## 2012-12-12 ENCOUNTER — Telehealth: Payer: Self-pay

## 2012-12-12 NOTE — Telephone Encounter (Signed)
Pt may take Wellbutrin and gabapentin at the same time, these should be separated from the dilaudid

## 2012-12-13 NOTE — Telephone Encounter (Signed)
Message left for pt

## 2013-03-07 ENCOUNTER — Ambulatory Visit: Payer: Medicare Other | Admitting: Family Medicine

## 2013-03-17 DIAGNOSIS — M5137 Other intervertebral disc degeneration, lumbosacral region: Secondary | ICD-10-CM | POA: Diagnosis not present

## 2013-03-17 DIAGNOSIS — M961 Postlaminectomy syndrome, not elsewhere classified: Secondary | ICD-10-CM | POA: Diagnosis not present

## 2013-03-17 DIAGNOSIS — M47812 Spondylosis without myelopathy or radiculopathy, cervical region: Secondary | ICD-10-CM | POA: Diagnosis not present

## 2013-03-17 DIAGNOSIS — M5030 Other cervical disc degeneration, unspecified cervical region: Secondary | ICD-10-CM | POA: Diagnosis not present

## 2013-04-01 DIAGNOSIS — Z79899 Other long term (current) drug therapy: Secondary | ICD-10-CM | POA: Diagnosis not present

## 2013-04-01 DIAGNOSIS — M47817 Spondylosis without myelopathy or radiculopathy, lumbosacral region: Secondary | ICD-10-CM | POA: Diagnosis not present

## 2013-04-01 DIAGNOSIS — F17200 Nicotine dependence, unspecified, uncomplicated: Secondary | ICD-10-CM | POA: Diagnosis not present

## 2013-04-01 DIAGNOSIS — M47812 Spondylosis without myelopathy or radiculopathy, cervical region: Secondary | ICD-10-CM | POA: Diagnosis not present

## 2013-04-01 DIAGNOSIS — M5030 Other cervical disc degeneration, unspecified cervical region: Secondary | ICD-10-CM | POA: Diagnosis not present

## 2013-04-01 DIAGNOSIS — M961 Postlaminectomy syndrome, not elsewhere classified: Secondary | ICD-10-CM | POA: Diagnosis not present

## 2013-04-01 DIAGNOSIS — Z981 Arthrodesis status: Secondary | ICD-10-CM | POA: Diagnosis not present

## 2013-04-01 DIAGNOSIS — M5137 Other intervertebral disc degeneration, lumbosacral region: Secondary | ICD-10-CM | POA: Diagnosis not present

## 2013-04-01 DIAGNOSIS — Z880 Allergy status to penicillin: Secondary | ICD-10-CM | POA: Diagnosis not present

## 2013-04-01 DIAGNOSIS — Z888 Allergy status to other drugs, medicaments and biological substances status: Secondary | ICD-10-CM | POA: Diagnosis not present

## 2013-04-01 DIAGNOSIS — E119 Type 2 diabetes mellitus without complications: Secondary | ICD-10-CM | POA: Diagnosis not present

## 2013-04-01 DIAGNOSIS — F319 Bipolar disorder, unspecified: Secondary | ICD-10-CM | POA: Diagnosis not present

## 2013-04-01 DIAGNOSIS — Z882 Allergy status to sulfonamides status: Secondary | ICD-10-CM | POA: Diagnosis not present

## 2013-04-01 DIAGNOSIS — IMO0001 Reserved for inherently not codable concepts without codable children: Secondary | ICD-10-CM | POA: Diagnosis not present

## 2013-04-01 DIAGNOSIS — G8929 Other chronic pain: Secondary | ICD-10-CM | POA: Diagnosis not present

## 2013-06-03 DIAGNOSIS — F17200 Nicotine dependence, unspecified, uncomplicated: Secondary | ICD-10-CM | POA: Diagnosis not present

## 2013-06-03 DIAGNOSIS — R079 Chest pain, unspecified: Secondary | ICD-10-CM | POA: Diagnosis not present

## 2013-06-03 DIAGNOSIS — H9209 Otalgia, unspecified ear: Secondary | ICD-10-CM | POA: Diagnosis not present

## 2013-06-03 DIAGNOSIS — Z79899 Other long term (current) drug therapy: Secondary | ICD-10-CM | POA: Diagnosis not present

## 2013-06-03 DIAGNOSIS — H6690 Otitis media, unspecified, unspecified ear: Secondary | ICD-10-CM | POA: Diagnosis not present

## 2013-06-03 DIAGNOSIS — E119 Type 2 diabetes mellitus without complications: Secondary | ICD-10-CM | POA: Diagnosis not present

## 2013-06-04 DIAGNOSIS — Z5320 Procedure and treatment not carried out because of patient's decision for unspecified reasons: Secondary | ICD-10-CM | POA: Diagnosis not present

## 2013-06-04 DIAGNOSIS — R079 Chest pain, unspecified: Secondary | ICD-10-CM | POA: Diagnosis not present

## 2013-06-26 ENCOUNTER — Other Ambulatory Visit: Payer: Self-pay

## 2013-09-24 DIAGNOSIS — M5030 Other cervical disc degeneration, unspecified cervical region: Secondary | ICD-10-CM | POA: Diagnosis not present

## 2013-09-24 DIAGNOSIS — M47812 Spondylosis without myelopathy or radiculopathy, cervical region: Secondary | ICD-10-CM | POA: Diagnosis not present

## 2013-09-24 DIAGNOSIS — M47817 Spondylosis without myelopathy or radiculopathy, lumbosacral region: Secondary | ICD-10-CM | POA: Diagnosis not present

## 2013-09-24 DIAGNOSIS — M5380 Other specified dorsopathies, site unspecified: Secondary | ICD-10-CM | POA: Diagnosis not present

## 2013-11-18 DIAGNOSIS — Z981 Arthrodesis status: Secondary | ICD-10-CM | POA: Diagnosis not present

## 2013-11-18 DIAGNOSIS — E119 Type 2 diabetes mellitus without complications: Secondary | ICD-10-CM | POA: Diagnosis not present

## 2013-11-18 DIAGNOSIS — Z882 Allergy status to sulfonamides status: Secondary | ICD-10-CM | POA: Diagnosis not present

## 2013-11-18 DIAGNOSIS — F319 Bipolar disorder, unspecified: Secondary | ICD-10-CM | POA: Diagnosis not present

## 2013-11-18 DIAGNOSIS — M5137 Other intervertebral disc degeneration, lumbosacral region: Secondary | ICD-10-CM | POA: Diagnosis not present

## 2013-11-18 DIAGNOSIS — M5380 Other specified dorsopathies, site unspecified: Secondary | ICD-10-CM | POA: Diagnosis not present

## 2013-11-18 DIAGNOSIS — M47817 Spondylosis without myelopathy or radiculopathy, lumbosacral region: Secondary | ICD-10-CM | POA: Diagnosis not present

## 2013-11-18 DIAGNOSIS — M5030 Other cervical disc degeneration, unspecified cervical region: Secondary | ICD-10-CM | POA: Diagnosis not present

## 2013-11-18 DIAGNOSIS — Z888 Allergy status to other drugs, medicaments and biological substances status: Secondary | ICD-10-CM | POA: Diagnosis not present

## 2013-11-18 DIAGNOSIS — Z79899 Other long term (current) drug therapy: Secondary | ICD-10-CM | POA: Diagnosis not present

## 2013-11-18 DIAGNOSIS — M961 Postlaminectomy syndrome, not elsewhere classified: Secondary | ICD-10-CM | POA: Diagnosis not present

## 2013-11-18 DIAGNOSIS — IMO0001 Reserved for inherently not codable concepts without codable children: Secondary | ICD-10-CM | POA: Diagnosis not present

## 2013-11-18 DIAGNOSIS — Z880 Allergy status to penicillin: Secondary | ICD-10-CM | POA: Diagnosis not present

## 2013-11-18 DIAGNOSIS — M47812 Spondylosis without myelopathy or radiculopathy, cervical region: Secondary | ICD-10-CM | POA: Diagnosis not present

## 2013-11-18 DIAGNOSIS — G8929 Other chronic pain: Secondary | ICD-10-CM | POA: Diagnosis not present

## 2013-11-18 DIAGNOSIS — F17200 Nicotine dependence, unspecified, uncomplicated: Secondary | ICD-10-CM | POA: Diagnosis not present

## 2013-12-11 DIAGNOSIS — Z981 Arthrodesis status: Secondary | ICD-10-CM | POA: Diagnosis not present

## 2013-12-11 DIAGNOSIS — Z888 Allergy status to other drugs, medicaments and biological substances status: Secondary | ICD-10-CM | POA: Diagnosis not present

## 2013-12-11 DIAGNOSIS — F17200 Nicotine dependence, unspecified, uncomplicated: Secondary | ICD-10-CM | POA: Diagnosis not present

## 2013-12-11 DIAGNOSIS — Z880 Allergy status to penicillin: Secondary | ICD-10-CM | POA: Diagnosis not present

## 2013-12-11 DIAGNOSIS — M5137 Other intervertebral disc degeneration, lumbosacral region: Secondary | ICD-10-CM | POA: Diagnosis not present

## 2013-12-11 DIAGNOSIS — M47812 Spondylosis without myelopathy or radiculopathy, cervical region: Secondary | ICD-10-CM | POA: Diagnosis not present

## 2013-12-11 DIAGNOSIS — IMO0001 Reserved for inherently not codable concepts without codable children: Secondary | ICD-10-CM | POA: Diagnosis not present

## 2013-12-11 DIAGNOSIS — G8929 Other chronic pain: Secondary | ICD-10-CM | POA: Diagnosis not present

## 2013-12-11 DIAGNOSIS — M5030 Other cervical disc degeneration, unspecified cervical region: Secondary | ICD-10-CM | POA: Diagnosis not present

## 2013-12-11 DIAGNOSIS — Z882 Allergy status to sulfonamides status: Secondary | ICD-10-CM | POA: Diagnosis not present

## 2013-12-11 DIAGNOSIS — E119 Type 2 diabetes mellitus without complications: Secondary | ICD-10-CM | POA: Diagnosis not present

## 2013-12-11 DIAGNOSIS — F319 Bipolar disorder, unspecified: Secondary | ICD-10-CM | POA: Diagnosis not present

## 2013-12-11 DIAGNOSIS — M47817 Spondylosis without myelopathy or radiculopathy, lumbosacral region: Secondary | ICD-10-CM | POA: Diagnosis not present

## 2013-12-11 DIAGNOSIS — Z79899 Other long term (current) drug therapy: Secondary | ICD-10-CM | POA: Diagnosis not present

## 2013-12-11 DIAGNOSIS — M961 Postlaminectomy syndrome, not elsewhere classified: Secondary | ICD-10-CM | POA: Diagnosis not present

## 2013-12-25 DIAGNOSIS — Z79899 Other long term (current) drug therapy: Secondary | ICD-10-CM | POA: Diagnosis not present

## 2014-01-02 ENCOUNTER — Encounter (HOSPITAL_COMMUNITY)
Admission: EM | Disposition: A | Discharge status: Home / Self Care | Payer: Self-pay | Source: Other Acute Inpatient Hospital | Attending: Cardiovascular Disease

## 2014-01-02 ENCOUNTER — Inpatient Hospital Stay (HOSPITAL_COMMUNITY)
Admission: EM | Admit: 2014-01-02 | Discharge: 2014-01-06 | DRG: 281 | Disposition: A | Discharge status: Home / Self Care | Payer: Medicare Other | Source: Other Acute Inpatient Hospital | Attending: Cardiovascular Disease | Admitting: Cardiovascular Disease

## 2014-01-02 ENCOUNTER — Encounter (HOSPITAL_COMMUNITY): Payer: Self-pay

## 2014-01-02 DIAGNOSIS — R079 Chest pain, unspecified: Secondary | ICD-10-CM | POA: Diagnosis not present

## 2014-01-02 DIAGNOSIS — F319 Bipolar disorder, unspecified: Secondary | ICD-10-CM | POA: Diagnosis present

## 2014-01-02 DIAGNOSIS — I252 Old myocardial infarction: Secondary | ICD-10-CM | POA: Diagnosis present

## 2014-01-02 DIAGNOSIS — R0789 Other chest pain: Secondary | ICD-10-CM | POA: Diagnosis not present

## 2014-01-02 DIAGNOSIS — I214 Non-ST elevation (NSTEMI) myocardial infarction: Secondary | ICD-10-CM

## 2014-01-02 DIAGNOSIS — J45909 Unspecified asthma, uncomplicated: Secondary | ICD-10-CM | POA: Diagnosis present

## 2014-01-02 DIAGNOSIS — E119 Type 2 diabetes mellitus without complications: Secondary | ICD-10-CM | POA: Diagnosis present

## 2014-01-02 DIAGNOSIS — R42 Dizziness and giddiness: Secondary | ICD-10-CM | POA: Diagnosis not present

## 2014-01-02 DIAGNOSIS — I420 Dilated cardiomyopathy: Secondary | ICD-10-CM

## 2014-01-02 DIAGNOSIS — R0602 Shortness of breath: Secondary | ICD-10-CM | POA: Diagnosis not present

## 2014-01-02 DIAGNOSIS — F411 Generalized anxiety disorder: Secondary | ICD-10-CM | POA: Diagnosis present

## 2014-01-02 DIAGNOSIS — R030 Elevated blood-pressure reading, without diagnosis of hypertension: Secondary | ICD-10-CM

## 2014-01-02 DIAGNOSIS — I2510 Atherosclerotic heart disease of native coronary artery without angina pectoris: Secondary | ICD-10-CM | POA: Diagnosis present

## 2014-01-02 DIAGNOSIS — E785 Hyperlipidemia, unspecified: Secondary | ICD-10-CM | POA: Diagnosis present

## 2014-01-02 DIAGNOSIS — I517 Cardiomegaly: Secondary | ICD-10-CM | POA: Diagnosis not present

## 2014-01-02 DIAGNOSIS — I428 Other cardiomyopathies: Secondary | ICD-10-CM | POA: Diagnosis not present

## 2014-01-02 DIAGNOSIS — I429 Cardiomyopathy, unspecified: Secondary | ICD-10-CM | POA: Diagnosis present

## 2014-01-02 DIAGNOSIS — F17200 Nicotine dependence, unspecified, uncomplicated: Secondary | ICD-10-CM | POA: Diagnosis present

## 2014-01-02 DIAGNOSIS — Z79899 Other long term (current) drug therapy: Secondary | ICD-10-CM | POA: Diagnosis not present

## 2014-01-02 DIAGNOSIS — M129 Arthropathy, unspecified: Secondary | ICD-10-CM | POA: Diagnosis present

## 2014-01-02 DIAGNOSIS — I219 Acute myocardial infarction, unspecified: Secondary | ICD-10-CM | POA: Diagnosis not present

## 2014-01-02 DIAGNOSIS — I10 Essential (primary) hypertension: Secondary | ICD-10-CM | POA: Diagnosis present

## 2014-01-02 DIAGNOSIS — Z720 Tobacco use: Secondary | ICD-10-CM

## 2014-01-02 DIAGNOSIS — I5181 Takotsubo syndrome: Secondary | ICD-10-CM | POA: Diagnosis present

## 2014-01-02 DIAGNOSIS — IMO0001 Reserved for inherently not codable concepts without codable children: Secondary | ICD-10-CM

## 2014-01-02 DIAGNOSIS — R05 Cough: Secondary | ICD-10-CM | POA: Diagnosis not present

## 2014-01-02 DIAGNOSIS — R059 Cough, unspecified: Secondary | ICD-10-CM | POA: Diagnosis not present

## 2014-01-02 HISTORY — PX: LEFT HEART CATHETERIZATION WITH CORONARY ANGIOGRAM: SHX5451

## 2014-01-02 LAB — COMPREHENSIVE METABOLIC PANEL
ALK PHOS: 111 U/L (ref 39–117)
ALT: 16 U/L (ref 0–35)
AST: 26 U/L (ref 0–37)
Albumin: 3.5 g/dL (ref 3.5–5.2)
BILIRUBIN TOTAL: 0.3 mg/dL (ref 0.3–1.2)
BUN: 6 mg/dL (ref 6–23)
CHLORIDE: 99 meq/L (ref 96–112)
CO2: 21 meq/L (ref 19–32)
Calcium: 8.9 mg/dL (ref 8.4–10.5)
Creatinine, Ser: 0.76 mg/dL (ref 0.50–1.10)
GLUCOSE: 171 mg/dL — AB (ref 70–99)
POTASSIUM: 4.4 meq/L (ref 3.7–5.3)
SODIUM: 136 meq/L — AB (ref 137–147)
Total Protein: 7.1 g/dL (ref 6.0–8.3)

## 2014-01-02 LAB — CBC
HCT: 39.2 % (ref 36.0–46.0)
Hemoglobin: 13.3 g/dL (ref 12.0–15.0)
MCH: 30 pg (ref 26.0–34.0)
MCHC: 33.9 g/dL (ref 30.0–36.0)
MCV: 88.3 fL (ref 78.0–100.0)
Platelets: 269 10*3/uL (ref 150–400)
RBC: 4.44 MIL/uL (ref 3.87–5.11)
RDW: 13.3 % (ref 11.5–15.5)
WBC: 14.8 10*3/uL — ABNORMAL HIGH (ref 4.0–10.5)

## 2014-01-02 LAB — MRSA PCR SCREENING: MRSA BY PCR: NEGATIVE

## 2014-01-02 LAB — TROPONIN I: Troponin I: 4.09 ng/mL (ref <0.30)

## 2014-01-02 LAB — GLUCOSE, CAPILLARY: Glucose-Capillary: 252 mg/dL — ABNORMAL HIGH (ref 70–99)

## 2014-01-02 SURGERY — LEFT HEART CATHETERIZATION WITH CORONARY ANGIOGRAM
Anesthesia: LOCAL

## 2014-01-02 MED ORDER — LISINOPRIL 5 MG PO TABS
5.0000 mg | ORAL_TABLET | Freq: Every day | ORAL | Status: DC
  Administered 2014-01-04 – 2014-01-06 (×3): 5 mg via ORAL
  Filled 2014-01-02 (×4): qty 1

## 2014-01-02 MED ORDER — OMEGA-3-ACID ETHYL ESTERS 1 G PO CAPS
1.0000 g | ORAL_CAPSULE | Freq: Two times a day (BID) | ORAL | Status: DC
  Administered 2014-01-02 – 2014-01-06 (×7): 1 g via ORAL
  Filled 2014-01-02 (×9): qty 1

## 2014-01-02 MED ORDER — LAMOTRIGINE 150 MG PO TABS
150.0000 mg | ORAL_TABLET | Freq: Two times a day (BID) | ORAL | Status: DC
  Administered 2014-01-02 – 2014-01-06 (×7): 150 mg via ORAL
  Filled 2014-01-02 (×9): qty 1

## 2014-01-02 MED ORDER — ALBUTEROL SULFATE HFA 108 (90 BASE) MCG/ACT IN AERS: 2.0000 | INHALATION_SPRAY | RESPIRATORY_TRACT | Status: DC | PRN

## 2014-01-02 MED ORDER — GABAPENTIN 300 MG PO CAPS
300.0000 mg | ORAL_CAPSULE | Freq: Three times a day (TID) | ORAL | Status: DC
  Administered 2014-01-02 – 2014-01-06 (×11): 300 mg via ORAL
  Filled 2014-01-02 (×13): qty 1

## 2014-01-02 MED ORDER — SERTRALINE HCL 50 MG PO TABS
75.0000 mg | ORAL_TABLET | Freq: Every day | ORAL | Status: DC
  Administered 2014-01-04 – 2014-01-06 (×3): 75 mg via ORAL
  Filled 2014-01-02 (×4): qty 1

## 2014-01-02 MED ORDER — CLOPIDOGREL BISULFATE 75 MG PO TABS
75.0000 mg | ORAL_TABLET | Freq: Every day | ORAL | Status: DC
  Administered 2014-01-03: 75 mg via ORAL
  Filled 2014-01-02 (×2): qty 1

## 2014-01-02 MED ORDER — ASPIRIN 81 MG PO CHEW
81.0000 mg | CHEWABLE_TABLET | Freq: Every day | ORAL | Status: DC
  Administered 2014-01-04 – 2014-01-06 (×3): 81 mg via ORAL
  Filled 2014-01-02 (×3): qty 1

## 2014-01-02 MED ORDER — HEPARIN (PORCINE) IN NACL 2-0.9 UNIT/ML-% IJ SOLN
INTRAMUSCULAR | Status: AC
  Filled 2014-01-02: qty 1000

## 2014-01-02 MED ORDER — CARVEDILOL 3.125 MG PO TABS
3.1250 mg | ORAL_TABLET | Freq: Two times a day (BID) | ORAL | Status: DC
  Administered 2014-01-02 – 2014-01-03 (×3): 3.125 mg via ORAL
  Filled 2014-01-02 (×6): qty 1

## 2014-01-02 MED ORDER — NITROGLYCERIN 0.4 MG SL SUBL
0.4000 mg | SUBLINGUAL_TABLET | SUBLINGUAL | Status: DC | PRN
  Administered 2014-01-03 (×4): 0.4 mg via SUBLINGUAL
  Filled 2014-01-02 (×2): qty 1

## 2014-01-02 MED ORDER — VERAPAMIL HCL 2.5 MG/ML IV SOLN
INTRAVENOUS | Status: AC
  Filled 2014-01-02: qty 2

## 2014-01-02 MED ORDER — CLOPIDOGREL BISULFATE 300 MG PO TABS
ORAL_TABLET | ORAL | Status: AC
  Filled 2014-01-02: qty 2

## 2014-01-02 MED ORDER — HYOSCYAMINE SULFATE 0.125 MG SL SUBL
0.1250 mg | SUBLINGUAL_TABLET | Freq: Three times a day (TID) | SUBLINGUAL | Status: DC
  Administered 2014-01-02 – 2014-01-05 (×12): 0.125 mg via SUBLINGUAL
  Filled 2014-01-02 (×18): qty 1

## 2014-01-02 MED ORDER — PEG 3350-KCL-NA BICARB-NACL 420 G PO SOLR: 4000.0000 mL | ORAL | Status: DC

## 2014-01-02 MED ORDER — ATORVASTATIN CALCIUM 80 MG PO TABS
80.0000 mg | ORAL_TABLET | Freq: Every day | ORAL | Status: DC
  Administered 2014-01-02 – 2014-01-06 (×5): 80 mg via ORAL
  Filled 2014-01-02 (×5): qty 1

## 2014-01-02 MED ORDER — FUROSEMIDE 10 MG/ML IJ SOLN
40.0000 mg | Freq: Once | INTRAMUSCULAR | Status: AC
  Administered 2014-01-02: 40 mg via INTRAVENOUS
  Filled 2014-01-02: qty 4

## 2014-01-02 MED ORDER — BUPROPION HCL ER (SR) 150 MG PO TB12
150.0000 mg | ORAL_TABLET | Freq: Two times a day (BID) | ORAL | Status: DC
  Administered 2014-01-02 – 2014-01-06 (×7): 150 mg via ORAL
  Filled 2014-01-02 (×9): qty 1

## 2014-01-02 MED ORDER — QUETIAPINE FUMARATE 25 MG PO TABS
25.0000 mg | ORAL_TABLET | Freq: Two times a day (BID) | ORAL | Status: DC | PRN
  Filled 2014-01-02: qty 1

## 2014-01-02 MED ORDER — SODIUM CHLORIDE 0.9 % IV SOLN: INTRAVENOUS | Status: AC

## 2014-01-02 MED ORDER — LIDOCAINE HCL (PF) 1 % IJ SOLN
INTRAMUSCULAR | Status: AC
  Filled 2014-01-02: qty 30

## 2014-01-02 MED ORDER — NITROGLYCERIN 0.2 MG/ML ON CALL CATH LAB
INTRAVENOUS | Status: AC
  Filled 2014-01-02: qty 1

## 2014-01-02 MED ORDER — OXYCODONE-ACETAMINOPHEN 5-325 MG PO TABS
1.0000 | ORAL_TABLET | ORAL | Status: DC | PRN
  Filled 2014-01-02 (×6): qty 2

## 2014-01-02 MED ORDER — MORPHINE SULFATE 2 MG/ML IJ SOLN
2.0000 mg | INTRAMUSCULAR | Status: DC | PRN
  Administered 2014-01-03 – 2014-01-05 (×7): 2 mg via INTRAVENOUS
  Filled 2014-01-02 (×7): qty 1

## 2014-01-02 NOTE — CV Procedure (Addendum)
      Cardiac Catheterization Operative Report  Erica Herrera [id number] 5/15/20157:41 PM Milinda Antis, MD  Procedure Performed:  1. Left Heart Catheterization 2. Selective Coronary Angiography 3. Left ventricular angiogram  Operator: Verne Carrow, MD  Arterial access site:  Right radial artery.   Indication: 48 yo female with history of DM, tobacco abuse, bipolar disorder with onset of chest pain this afternoon, emergent cardiac cath with slight ST elevation inferior leads. Ongoing mild chest pain.                                       Procedure Details: The risks, benefits, complications, treatment options, and expected outcomes were discussed with the patient. The patient agreed with plan for cath and emergency consent was placed on the chart. The patient was brought to the cath lab via EMS. The patient was further sedated with Versed and Fentanyl. The right wrist was assessed with an Allens test which was positive. The right wrist was prepped and draped in a sterile fashion. 1% lidocaine was used for local anesthesia. Using the modified Seldinger access technique, a 5 French sheath was placed in the right radial artery. 3 mg Verapamil was given through the sheath. She was on a heparin drip upon arrival to the cath lab so no additional heparin was given.  Standard diagnostic catheters were used to perform selective coronary angiography. A pigtail catheter was used to perform a left ventricular angiogram. The sheath was removed from the right radial artery and a Terumo hemostasis band was applied at the arteriotomy site on the right wrist.    There were no immediate complications. The patient was taken to the recovery area in stable condition.   Hemodynamic Findings: Central aortic pressure: 145/83 Left ventricular pressure: 138/17/26  Angiographic Findings:  Left main: No obstructive disease.   Left Anterior Descending Artery: Large caliber vessel that courses to the  apex. The mid vessel has two long smooth 30% stenoses. There is sluggish flow to the apex but no evidence of dissection, ruptured plaque or obstructive lesions. The diagonal branch is small to moderate in caliber with proximal 30% stenosis. Addendum 01/03/14: After closer review of cath films, there is a filling defect in the very distal, very small caliber (0.5-0.75 mm) apical LAD after it wraps around the apex, likely thrombus. This supplies a small portion of myocardium.   Circumflex Artery: Large caliber vessel with three moderate caliber obtuse marginal branches. No obstructive disease.   Right Coronary Artery: Moderate caliber non-dominant vessel with no obstructive disease.   Left Ventricular Angiogram: LVEF=25-30% with anterior, apical and inferoapical hypokinesis.   Impression: 1. Mild non-obstructive CAD 2. Segmental LV systolic dysfunction in a pattern with stress induced cardiomyopathy (Takotsubo's cardiomyopathy) 3. Resolution of chest pain  Recommendations: Will manage with ASA and Plavix for at least one month. Cycle cardiac markers. Stepdown ICU admission. Lasix IV tonight with elevated LVEDP. Will start low dose Ace-inh, low dose beta blocker. Will start statin. Echo tomorrow.        Complications:  None. The patient tolerated the procedure well.

## 2014-01-02 NOTE — Progress Notes (Signed)
CRITICAL VALUE ALERT  Critical value received:  Trop 4.09  Date of notification:  01/02/14  Time of notification:  2139  Critical value read back:yes  Nurse who received alert:  Karena Addison  MD notified (1st page):  espected  Time of first page:    MD notified (2nd page):  Time of second page:  Responding MD:    Time MD responded:
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
## 2014-01-03 ENCOUNTER — Ambulatory Visit (HOSPITAL_COMMUNITY): Admit: 2014-01-03 | Payer: Self-pay | Admitting: Cardiovascular Disease

## 2014-01-03 ENCOUNTER — Inpatient Hospital Stay (HOSPITAL_COMMUNITY): Payer: Medicare Other

## 2014-01-03 ENCOUNTER — Encounter (HOSPITAL_COMMUNITY)
Admission: EM | Disposition: A | Discharge status: Home / Self Care | Payer: Self-pay | Source: Other Acute Inpatient Hospital | Attending: Cardiovascular Disease

## 2014-01-03 DIAGNOSIS — I252 Old myocardial infarction: Secondary | ICD-10-CM | POA: Diagnosis present

## 2014-01-03 DIAGNOSIS — I5181 Takotsubo syndrome: Secondary | ICD-10-CM | POA: Diagnosis present

## 2014-01-03 DIAGNOSIS — R079 Chest pain, unspecified: Secondary | ICD-10-CM

## 2014-01-03 DIAGNOSIS — I517 Cardiomegaly: Secondary | ICD-10-CM

## 2014-01-03 DIAGNOSIS — I10 Essential (primary) hypertension: Secondary | ICD-10-CM

## 2014-01-03 HISTORY — PX: LEFT HEART CATHETERIZATION WITH CORONARY ANGIOGRAM: SHX5451

## 2014-01-03 LAB — CBC
HCT: 40.1 % (ref 36.0–46.0)
Hemoglobin: 13.4 g/dL (ref 12.0–15.0)
MCH: 29.9 pg (ref 26.0–34.0)
MCHC: 33.4 g/dL (ref 30.0–36.0)
MCV: 89.5 fL (ref 78.0–100.0)
PLATELETS: 248 10*3/uL (ref 150–400)
RBC: 4.48 MIL/uL (ref 3.87–5.11)
RDW: 13.4 % (ref 11.5–15.5)
WBC: 11.6 10*3/uL — ABNORMAL HIGH (ref 4.0–10.5)

## 2014-01-03 LAB — BASIC METABOLIC PANEL
BUN: 8 mg/dL (ref 6–23)
CO2: 23 mEq/L (ref 19–32)
Calcium: 9.2 mg/dL (ref 8.4–10.5)
Chloride: 97 mEq/L (ref 96–112)
Creatinine, Ser: 0.84 mg/dL (ref 0.50–1.10)
GFR calc Af Amer: >90 mL/min (ref >90)
GFR, EST NON AFRICAN AMERICAN: 80 mL/min — AB (ref >90)
GLUCOSE: 251 mg/dL — AB (ref 70–99)
Potassium: 3.9 mEq/L (ref 3.7–5.3)
SODIUM: 136 meq/L — AB (ref 137–147)

## 2014-01-03 LAB — GLUCOSE, CAPILLARY
Glucose-Capillary: 247 mg/dL — ABNORMAL HIGH (ref 70–99)
Glucose-Capillary: 263 mg/dL — ABNORMAL HIGH (ref 70–99)
Glucose-Capillary: 238 mg/dL — ABNORMAL HIGH (ref 70–99)

## 2014-01-03 LAB — TROPONIN I
Troponin I: 8.18 ng/mL (ref <0.30)
Troponin I: 15.62 ng/mL (ref <0.30)

## 2014-01-03 SURGERY — LEFT HEART CATHETERIZATION WITH CORONARY ANGIOGRAM
Anesthesia: LOCAL

## 2014-01-03 MED ORDER — LORAZEPAM 2 MG/ML IJ SOLN: 1.0000 mg | Freq: Once | INTRAMUSCULAR | Status: DC

## 2014-01-03 MED ORDER — TICAGRELOR 90 MG PO TABS
90.0000 mg | ORAL_TABLET | Freq: Two times a day (BID) | ORAL | Status: DC
  Administered 2014-01-03 – 2014-01-06 (×7): 90 mg via ORAL
  Filled 2014-01-03 (×7): qty 1

## 2014-01-03 MED ORDER — NITROGLYCERIN 2 % TD OINT
1.0000 [in_us] | TOPICAL_OINTMENT | Freq: Four times a day (QID) | TRANSDERMAL | Status: DC
  Administered 2014-01-03: 1 [in_us] via TOPICAL
  Filled 2014-01-03: qty 30

## 2014-01-03 MED ORDER — HEPARIN SODIUM (PORCINE) 1000 UNIT/ML IJ SOLN
INTRAMUSCULAR | Status: AC
Start: 2014-01-03 — End: 2014-01-03
  Filled 2014-01-03: qty 1

## 2014-01-03 MED ORDER — HEPARIN (PORCINE) IN NACL 2-0.9 UNIT/ML-% IJ SOLN
INTRAMUSCULAR | Status: AC
  Filled 2014-01-03: qty 1000

## 2014-01-03 MED ORDER — TICAGRELOR 90 MG PO TABS: 180.0000 mg | ORAL_TABLET | Freq: Once | ORAL | Status: DC

## 2014-01-03 MED ORDER — INSULIN ASPART 100 UNIT/ML ~~LOC~~ SOLN
0.0000 [IU] | SUBCUTANEOUS | Status: DC
  Administered 2014-01-03: 8 [IU] via SUBCUTANEOUS
  Administered 2014-01-03: 12 [IU] via SUBCUTANEOUS
  Administered 2014-01-03 (×2): 8 [IU] via SUBCUTANEOUS

## 2014-01-03 MED ORDER — HEPARIN (PORCINE) IN NACL 100-0.45 UNIT/ML-% IJ SOLN
1100.0000 [IU]/h | INTRAMUSCULAR | Status: DC
  Administered 2014-01-03: 1100 [IU]/h via INTRAVENOUS
  Filled 2014-01-03: qty 250

## 2014-01-03 MED ORDER — LIDOCAINE HCL (PF) 1 % IJ SOLN
INTRAMUSCULAR | Status: AC
  Filled 2014-01-03: qty 30

## 2014-01-03 MED ORDER — VERAPAMIL HCL 2.5 MG/ML IV SOLN
INTRAVENOUS | Status: AC
  Filled 2014-01-03: qty 2

## 2014-01-03 MED ORDER — FENTANYL CITRATE 0.05 MG/ML IJ SOLN
INTRAMUSCULAR | Status: AC
  Filled 2014-01-03: qty 2

## 2014-01-03 MED ORDER — HEPARIN BOLUS VIA INFUSION
4000.0000 [IU] | Freq: Once | INTRAVENOUS | Status: AC
  Administered 2014-01-03: 4000 [IU] via INTRAVENOUS
  Filled 2014-01-03: qty 4000

## 2014-01-03 MED ORDER — ONDANSETRON HCL 4 MG/2ML IJ SOLN
4.0000 mg | Freq: Four times a day (QID) | INTRAMUSCULAR | Status: DC | PRN
  Administered 2014-01-03: 4 mg via INTRAVENOUS
  Filled 2014-01-03: qty 2

## 2014-01-03 MED ORDER — NITROGLYCERIN IN D5W 200-5 MCG/ML-% IV SOLN
2.0000 ug/min | INTRAVENOUS | Status: DC
  Administered 2014-01-03: 10 ug/min via INTRAVENOUS

## 2014-01-03 MED ORDER — LORAZEPAM 2 MG/ML IJ SOLN
1.0000 mg | Freq: Once | INTRAMUSCULAR | Status: AC
  Administered 2014-01-03: 1 mg via INTRAVENOUS
  Filled 2014-01-03: qty 1

## 2014-01-03 MED ORDER — NITROGLYCERIN 2 % TD OINT
1.0000 [in_us] | TOPICAL_OINTMENT | Freq: Four times a day (QID) | TRANSDERMAL | Status: DC
  Administered 2014-01-03 – 2014-01-06 (×11): 1 [in_us] via TOPICAL
  Filled 2014-01-03: qty 30

## 2014-01-03 MED ORDER — ONDANSETRON HCL 4 MG PO TABS
4.0000 mg | ORAL_TABLET | Freq: Three times a day (TID) | ORAL | Status: DC | PRN
  Filled 2014-01-03: qty 1

## 2014-01-03 MED ORDER — FUROSEMIDE 10 MG/ML IJ SOLN
40.0000 mg | Freq: Two times a day (BID) | INTRAMUSCULAR | Status: DC
  Administered 2014-01-03 – 2014-01-06 (×6): 40 mg via INTRAVENOUS
  Filled 2014-01-03 (×8): qty 4

## 2014-01-03 MED ORDER — HEPARIN (PORCINE) IN NACL 100-0.45 UNIT/ML-% IJ SOLN
1850.0000 [IU]/h | INTRAMUSCULAR | Status: DC
  Administered 2014-01-03: 1000 [IU]/h via INTRAVENOUS
  Administered 2014-01-04: 1300 [IU]/h via INTRAVENOUS
  Administered 2014-01-04: 1600 [IU]/h via INTRAVENOUS
  Filled 2014-01-03 (×6): qty 250

## 2014-01-03 MED ORDER — SODIUM CHLORIDE 0.9 % IV SOLN
INTRAVENOUS | Status: AC
  Administered 2014-01-03: 50 mL via INTRAVENOUS

## 2014-01-03 MED ORDER — MIDAZOLAM HCL 2 MG/2ML IJ SOLN
INTRAMUSCULAR | Status: AC
  Filled 2014-01-03: qty 2

## 2014-01-03 MED ORDER — NITROGLYCERIN IN D5W 200-5 MCG/ML-% IV SOLN
INTRAVENOUS | Status: AC
  Filled 2014-01-03: qty 250

## 2014-01-03 NOTE — CV Procedure (Signed)
Cardiac Catheterization Operative Report  Erica Herrera [id number] 5/16/20152:04 PM Milinda Antis, MD  Procedure Performed:  1. Left Heart Catheterization 2. Selective Coronary Angiography  Operator: Verne Carrow, MD  Arterial access site:  Right radial artery.   Indication:  48 yo female with history of tobacco abuse, DM admitted 01/02/14 with inferior ST elevation. Emergent cardiac cath with normal RCA, normal Circumflex, moderate smooth mid LAD stenosis without appearance of plaque rupture with occlusion of the very small apical LAD c/w possible distal embolization. Her LV wall motion abnormalities are out of proportion to her degree of CAD. She has had multiple episodes of chest pain this am and overall looks poorly. BP has been stable.  I cannot explain her ongoing chest pain, rising troponin and severe multi-segment LV dysfunction based on her coronary angiogram. Re-look cath today to assess LAD.                                   Procedure Details: The risks, benefits, complications, treatment options, and expected outcomes were discussed with the patient. The patient and/or family concurred with the proposed plan, giving informed consent. The patient was brought to the cath lab after IV hydration was begun and oral premedication was given. The patient was further sedated with Versed and Fentanyl. The right wrist was assessed with an Allens test which was positive. The right wrist was prepped and draped in a sterile fashion. 1% lidocaine was used for local anesthesia. Using the modified Seldinger access technique, a 5 French sheath was placed in the right radial artery. 3 mg Verapamil was given through the sheath. 6000 units IV heparin was given. Standard diagnostic catheters were used to perform selective coronary angiography. A pigtail catheter was used to measure LV pressures.   The sheath was removed from the right radial artery and a Terumo hemostasis band was applied  at the arteriotomy site on the right wrist. There were no immediate complications. The patient was taken to the recovery area in stable condition.   Hemodynamic Findings: Central aortic pressure: 153/89 Left ventricular pressure: 156/16/23  Angiographic Findings:  Left main: No obstructive disease.   Left Anterior Descending Artery: Large caliber vessel that courses to the apex with brisk, TIMI-3 flow. The mid vessel has luminal irregularities with no more than 10% stenosis in two areas. There is no dye staining in the mid vessel. The very small caliber (0.75 mm) apical LAD has a filling defect beyond the apex, likely representing small thrombus. This vessel is too small for PCI or stenting and only supplies a very small portion of her myocardium. The diagonal branch is small to moderate in caliber with proximal 10% stenosis.   Circumflex Artery: Large caliber vessel with three moderate caliber obtuse marginal branches. No obstructive disease.   Right Coronary Artery: Moderate caliber non-dominant vessel with no obstructive disease.   Left Ventricular Angiogram: Deferred.  Impression: 1. No obstructive disease in the RCA or Circumflex 2. Mild luminal irregularities in the mid LAD with no evidence of plaque rupture, dye staining or dissection 3. Persistent occlusion of very small distal LAD after it wraps around the apex, likely representing thrombus but too small for PCI  4. Severe segmental LV systolic dysfunction.  5. Her clinical picture is confusing but likely represents resolved thrombus in the LAD with distal embolization resulting in segmental LV dysfunction, elevated troponin and chest pain. No indication for  PCI. Her LV gram appears consistent with Takotsubo's cardiomyopathy. The degree of LV dysfunction is hard to explain by the possibility of resolved thrombus alone although if there was proximal or mid LAD thrombus prior to cath last night, this could be a possibility.    Recommendations: Will change Plavix to Brilinta. Will continue ASA. Will resume heparin drip tonight after sheath pull and continue x 24 more hours. Treatment of systolic dysfunction with beta blocker, Ace-inh. Continue statin. IV Lasix for further diuresis.        Complications:  None. The patient tolerated the procedure well.

## 2014-01-03 NOTE — Progress Notes (Signed)
Pt c/o discomfort upper chest to shoulder 3/10 pain. Pt stated " feels like a spasm.  Gave 2mg  morphine iv. Pt still had no relief from medication.  Obtained EKG gave 1 SL NTG at 0704 BP 164/85 no relief.  Gave 2nd SL NTG at 0710 bp 147/77. min relief.  Gave 3rd SL NTG at 0715 Bp 121/59  2/10 pain.   Md aware will continue to monitor. Alethia Berthold

## 2014-01-03 NOTE — Progress Notes (Signed)
SUBJECTIVE:  7/10 chest pain this AM.  Mid chest and up to the left shoulder.  Some improvement with SLNTG and morphine.  EKG as below.  She has not had an calf pain or tenderness and has had mechanical DVT prophylaxis.     PHYSICAL EXAM Filed Vitals:   01/03/14 0346 01/03/14 0700 01/03/14 0705 01/03/14 0710  BP:  172/68 164/85 133/73  Pulse:      Temp:      TempSrc:      Resp:  24 23 20   Height:      Weight: 187 lb 2.7 oz (84.9 kg)     SpO2:       General:  She looks somewhat uncomfortable.   Lungs:  Clear with deep coughing Heart:  RRR, no rub Neck:  No JVD Abdomen:  Positive bowel sounds, no rebound no guarding Extremities:  No swelling or calf tenderness, right radial site intact  LABS: Lab Results  Component Value Date   TROPONINI 8.18* 01/03/2014   Results for orders placed during the hospital encounter of 01/02/14 (from the past 24 hour(s))  MRSA PCR SCREENING     Status: None   Collection Time    01/02/14  8:07 PM      Result Value Ref Range   MRSA by PCR NEGATIVE  NEGATIVE  TROPONIN I     Status: Abnormal   Collection Time    01/02/14  8:40 PM      Result Value Ref Range   Troponin I 4.09 (*) <0.30 ng/mL  COMPREHENSIVE METABOLIC PANEL     Status: Abnormal   Collection Time    01/02/14  8:40 PM      Result Value Ref Range   Sodium 136 (*) 137 - 147 mEq/L   Potassium 4.4  3.7 - 5.3 mEq/L   Chloride 99  96 - 112 mEq/L   CO2 21  19 - 32 mEq/L   Glucose, Bld 171 (*) 70 - 99 mg/dL   BUN 6  6 - 23 mg/dL   Creatinine, Ser 1.610.76  0.50 - 1.10 mg/dL   Calcium 8.9  8.4 - 09.610.5 mg/dL   Total Protein 7.1  6.0 - 8.3 g/dL   Albumin 3.5  3.5 - 5.2 g/dL   AST 26  0 - 37 U/L   ALT 16  0 - 35 U/L   Alkaline Phosphatase 111  39 - 117 U/L   Total Bilirubin 0.3  0.3 - 1.2 mg/dL   GFR calc non Af Amer >90  >90 mL/min   GFR calc Af Amer >90  >90 mL/min  CBC     Status: Abnormal   Collection Time    01/02/14  8:40 PM      Result Value Ref Range   WBC 14.8 (*) 4.0 -  10.5 K/uL   RBC 4.44  3.87 - 5.11 MIL/uL   Hemoglobin 13.3  12.0 - 15.0 g/dL   HCT 04.539.2  40.936.0 - 81.146.0 %   MCV 88.3  78.0 - 100.0 fL   MCH 30.0  26.0 - 34.0 pg   MCHC 33.9  30.0 - 36.0 g/dL   RDW 91.413.3  78.211.5 - 95.615.5 %   Platelets 269  150 - 400 K/uL  GLUCOSE, CAPILLARY     Status: Abnormal   Collection Time    01/02/14 10:24 PM      Result Value Ref Range   Glucose-Capillary 252 (*) 70 - 99 mg/dL  TROPONIN I  Status: Abnormal   Collection Time    01/03/14  1:40 AM      Result Value Ref Range   Troponin I 8.18 (*) <0.30 ng/mL  BASIC METABOLIC PANEL     Status: Abnormal   Collection Time    01/03/14  1:40 AM      Result Value Ref Range   Sodium 136 (*) 137 - 147 mEq/L   Potassium 3.9  3.7 - 5.3 mEq/L   Chloride 97  96 - 112 mEq/L   CO2 23  19 - 32 mEq/L   Glucose, Bld 251 (*) 70 - 99 mg/dL   BUN 8  6 - 23 mg/dL   Creatinine, Ser 0.07  0.50 - 1.10 mg/dL   Calcium 9.2  8.4 - 62.2 mg/dL   GFR calc non Af Amer 80 (*) >90 mL/min   GFR calc Af Amer >90  >90 mL/min  CBC     Status: Abnormal   Collection Time    01/03/14  1:40 AM      Result Value Ref Range   WBC 11.6 (*) 4.0 - 10.5 K/uL   RBC 4.48  3.87 - 5.11 MIL/uL   Hemoglobin 13.4  12.0 - 15.0 g/dL   HCT 63.3  35.4 - 56.2 %   MCV 89.5  78.0 - 100.0 fL   MCH 29.9  26.0 - 34.0 pg   MCHC 33.4  30.0 - 36.0 g/dL   RDW 56.3  89.3 - 73.4 %   Platelets 248  150 - 400 K/uL    Intake/Output Summary (Last 24 hours) at 01/03/14 0721 Last data filed at 01/03/14 0600  Gross per 24 hour  Intake   1020 ml  Output   3450 ml  Net  -2430 ml    EKG:  NSR, rate 92, poor anterior R wave progression with QT prolongation new, T wave inversion new since previous.    ASSESSMENT AND PLAN:  Takatsubo Cardiomyopathy:  Troponin still going up and ongoing pain.  Start NTG paste.  Continue to follow closely in the step down unit today.  Continue other meds as ordered.    CAD:  Cath yesterday with nonobstructive disease.   Medical management.     HTN:  This will be managed in the context of treating the above.    DM:  Continue previous meds with SSI.   Fayrene [facility] 01/03/2014 7:21 AM
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
## 2014-01-03 NOTE — Progress Notes (Addendum)
Echo Lab  2D Echocardiogram completed.  Melissa L Morford, RDCS 01/03/2014 9:39 AM   I have looked at the films.  There is evidence of apical thrombus.  The LAD has the appearance of a mid plaque and I suspect that this was a ruptured plaque in a large wrap around LAD.  We will start heparin.  I would suggest possibly taking her back to the cath lab if she continues to have pain as she might IVUS possible PCI.

## 2014-01-03 NOTE — H&P (View-Only) (Signed)
  SUBJECTIVE:  7/10 chest pain this AM.  Mid chest and up to the left shoulder.  Some improvement with SLNTG and morphine.  EKG as below.  She has not had an calf pain or tenderness and has had mechanical DVT prophylaxis.     PHYSICAL EXAM Filed Vitals:   01/03/14 0346 01/03/14 0700 01/03/14 0705 01/03/14 0710  BP:  172/68 164/85 133/73  Pulse:      Temp:      TempSrc:      Resp:  24 23 20  Height:      Weight: 187 lb 2.7 oz (84.9 kg)     SpO2:       General:  She looks somewhat uncomfortable.   Lungs:  Clear with deep coughing Heart:  RRR, no rub Neck:  No JVD Abdomen:  Positive bowel sounds, no rebound no guarding Extremities:  No swelling or calf tenderness, right radial site intact  LABS: Lab Results  Component Value Date   TROPONINI 8.18* 01/03/2014   Results for orders placed during the hospital encounter of 01/02/14 (from the past 24 hour(s))  MRSA PCR SCREENING     Status: None   Collection Time    01/02/14  8:07 PM      Result Value Ref Range   MRSA by PCR NEGATIVE  NEGATIVE  TROPONIN I     Status: Abnormal   Collection Time    01/02/14  8:40 PM      Result Value Ref Range   Troponin I 4.09 (*) <0.30 ng/mL  COMPREHENSIVE METABOLIC PANEL     Status: Abnormal   Collection Time    01/02/14  8:40 PM      Result Value Ref Range   Sodium 136 (*) 137 - 147 mEq/L   Potassium 4.4  3.7 - 5.3 mEq/L   Chloride 99  96 - 112 mEq/L   CO2 21  19 - 32 mEq/L   Glucose, Bld 171 (*) 70 - 99 mg/dL   BUN 6  6 - 23 mg/dL   Creatinine, Ser 0.76  0.50 - 1.10 mg/dL   Calcium 8.9  8.4 - 10.5 mg/dL   Total Protein 7.1  6.0 - 8.3 g/dL   Albumin 3.5  3.5 - 5.2 g/dL   AST 26  0 - 37 U/L   ALT 16  0 - 35 U/L   Alkaline Phosphatase 111  39 - 117 U/L   Total Bilirubin 0.3  0.3 - 1.2 mg/dL   GFR calc non Af Amer >90  >90 mL/min   GFR calc Af Amer >90  >90 mL/min  CBC     Status: Abnormal   Collection Time    01/02/14  8:40 PM      Result Value Ref Range   WBC 14.8 (*) 4.0 -  10.5 K/uL   RBC 4.44  3.87 - 5.11 MIL/uL   Hemoglobin 13.3  12.0 - 15.0 g/dL   HCT 39.2  36.0 - 46.0 %   MCV 88.3  78.0 - 100.0 fL   MCH 30.0  26.0 - 34.0 pg   MCHC 33.9  30.0 - 36.0 g/dL   RDW 13.3  11.5 - 15.5 %   Platelets 269  150 - 400 K/uL  GLUCOSE, CAPILLARY     Status: Abnormal   Collection Time    01/02/14 10:24 PM      Result Value Ref Range   Glucose-Capillary 252 (*) 70 - 99 mg/dL  TROPONIN I       Status: Abnormal   Collection Time    01/03/14  1:40 AM      Result Value Ref Range   Troponin I 8.18 (*) <0.30 ng/mL  BASIC METABOLIC PANEL     Status: Abnormal   Collection Time    01/03/14  1:40 AM      Result Value Ref Range   Sodium 136 (*) 137 - 147 mEq/L   Potassium 3.9  3.7 - 5.3 mEq/L   Chloride 97  96 - 112 mEq/L   CO2 23  19 - 32 mEq/L   Glucose, Bld 251 (*) 70 - 99 mg/dL   BUN 8  6 - 23 mg/dL   Creatinine, Ser 0.07  0.50 - 1.10 mg/dL   Calcium 9.2  8.4 - 62.2 mg/dL   GFR calc non Af Amer 80 (*) >90 mL/min   GFR calc Af Amer >90  >90 mL/min  CBC     Status: Abnormal   Collection Time    01/03/14  1:40 AM      Result Value Ref Range   WBC 11.6 (*) 4.0 - 10.5 K/uL   RBC 4.48  3.87 - 5.11 MIL/uL   Hemoglobin 13.4  12.0 - 15.0 g/dL   HCT 63.3  35.4 - 56.2 %   MCV 89.5  78.0 - 100.0 fL   MCH 29.9  26.0 - 34.0 pg   MCHC 33.4  30.0 - 36.0 g/dL   RDW 56.3  89.3 - 73.4 %   Platelets 248  150 - 400 K/uL    Intake/Output Summary (Last 24 hours) at 01/03/14 0721 Last data filed at 01/03/14 0600  Gross per 24 hour  Intake   1020 ml  Output   3450 ml  Net  -2430 ml    EKG:  NSR, rate 92, poor anterior R wave progression with QT prolongation new, T wave inversion new since previous.    ASSESSMENT AND PLAN:  Takatsubo Cardiomyopathy:  Troponin still going up and ongoing pain.  Start NTG paste.  Continue to follow closely in the step down unit today.  Continue other meds as ordered.    CAD:  Cath yesterday with nonobstructive disease.   Medical management.     HTN:  This will be managed in the context of treating the above.    DM:  Continue previous meds with SSI.   Fayrene [facility] 01/03/2014 7:21 AM
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
## 2014-01-03 NOTE — Interval H&P Note (Signed)
History and Physical Interval Note:  01/03/2014 2:01 PM  Erica Herrera  has presented today for cardiac cath with the diagnosis of non- stemi, ongoing chest pain.  The various methods of treatment have been discussed with the patient and family. After consideration of risks, benefits and other options for treatment, the patient has consented to  Procedure(s): LEFT HEART CATHETERIZATION WITH CORONARY ANGIOGRAM (N/A) as a surgical intervention .  The patient's history has been reviewed, patient examined, no change in status, stable for surgery.  I have reviewed the patient's chart and labs.  Questions were answered to the patient's satisfaction.    Cath Lab Visit (complete for each Cath Lab visit)  Clinical Evaluation Leading to the Procedure:   ACS: yes  Non-ACS:    Anginal Classification: CCS IV  Anti-ischemic medical therapy: Maximal Therapy (2 or more classes of medications)  Non-Invasive Test Results: No non-invasive testing performed  Prior CABG: No previous CABG         Erica Herrera

## 2014-01-03 NOTE — Progress Notes (Signed)
I came in to begin MI education, but patient was actively having chest pain.  I called patient's nurse to make her aware.  I reviewed chart, but was not able to begin education or ambulation due to patient's chest pain. [phone]

## 2014-01-04 DIAGNOSIS — I5181 Takotsubo syndrome: Secondary | ICD-10-CM

## 2014-01-04 LAB — GLUCOSE, CAPILLARY
GLUCOSE-CAPILLARY: 284 mg/dL — AB (ref 70–99)
Glucose-Capillary: 204 mg/dL — ABNORMAL HIGH (ref 70–99)
Glucose-Capillary: 257 mg/dL — ABNORMAL HIGH (ref 70–99)
Glucose-Capillary: 227 mg/dL — ABNORMAL HIGH (ref 70–99)
Glucose-Capillary: 203 mg/dL — ABNORMAL HIGH (ref 70–99)

## 2014-01-04 LAB — TROPONIN I: Troponin I: 2.22 ng/mL (ref <0.30)

## 2014-01-04 LAB — CBC
HCT: 40.5 % (ref 36.0–46.0)
Hemoglobin: 14 g/dL (ref 12.0–15.0)
MCH: 30.8 pg (ref 26.0–34.0)
MCHC: 34.6 g/dL (ref 30.0–36.0)
MCV: 89.2 fL (ref 78.0–100.0)
Platelets: 252 10*3/uL (ref 150–400)
RBC: 4.54 MIL/uL (ref 3.87–5.11)
RDW: 13.6 % (ref 11.5–15.5)
WBC: 15.6 10*3/uL — ABNORMAL HIGH (ref 4.0–10.5)

## 2014-01-04 LAB — BASIC METABOLIC PANEL
BUN: 12 mg/dL (ref 6–23)
CALCIUM: 9.3 mg/dL (ref 8.4–10.5)
CO2: 24 meq/L (ref 19–32)
Chloride: 91 mEq/L — ABNORMAL LOW (ref 96–112)
Creatinine, Ser: 0.84 mg/dL (ref 0.50–1.10)
GFR calc Af Amer: >90 mL/min (ref >90)
GFR calc non Af Amer: 80 mL/min — ABNORMAL LOW (ref >90)
GLUCOSE: 273 mg/dL — AB (ref 70–99)
Potassium: 3.8 mEq/L (ref 3.7–5.3)
SODIUM: 134 meq/L — AB (ref 137–147)

## 2014-01-04 LAB — HEPARIN LEVEL (UNFRACTIONATED)
Heparin Unfractionated: <0.1 IU/mL — ABNORMAL LOW (ref 0.30–0.70)
Heparin Unfractionated: <0.1 IU/mL — ABNORMAL LOW (ref 0.30–0.70)

## 2014-01-04 LAB — POCT ACTIVATED CLOTTING TIME: ACTIVATED CLOTTING TIME: 199 s

## 2014-01-04 MED ORDER — INSULIN ASPART 100 UNIT/ML ~~LOC~~ SOLN
0.0000 [IU] | Freq: Every day | SUBCUTANEOUS | Status: DC
  Administered 2014-01-04: 2 [IU] via SUBCUTANEOUS
  Administered 2014-01-05: 23:00:00 via SUBCUTANEOUS

## 2014-01-04 MED ORDER — INSULIN ASPART 100 UNIT/ML ~~LOC~~ SOLN
0.0000 [IU] | Freq: Three times a day (TID) | SUBCUTANEOUS | Status: DC
  Administered 2014-01-04: 8 [IU] via SUBCUTANEOUS
  Administered 2014-01-04: 5 [IU] via SUBCUTANEOUS
  Administered 2014-01-04 – 2014-01-05 (×2): 8 [IU] via SUBCUTANEOUS
  Administered 2014-01-05 – 2014-01-06 (×5): 3 [IU] via SUBCUTANEOUS

## 2014-01-04 MED ORDER — CARVEDILOL 6.25 MG PO TABS
6.2500 mg | ORAL_TABLET | Freq: Two times a day (BID) | ORAL | Status: DC
  Administered 2014-01-04 – 2014-01-06 (×4): 6.25 mg via ORAL
  Filled 2014-01-04 (×6): qty 1

## 2014-01-04 NOTE — Progress Notes (Signed)
    SUBJECTIVE:  Much better this am.  Having some mild discomfort with deep inspiration.  No acute SOB   PHYSICAL EXAM Filed Vitals:   01/03/14 2321 01/04/14 0416 01/04/14 0515 01/04/14 0747  BP: 135/63 112/58  114/60  Pulse: 117 110    Temp: 98.7 F (37.1 C) 98.4 F (36.9 C)  98.7 F (37.1 C)  TempSrc: Oral Oral  Oral  Resp: 27 21  23   Height:      Weight:   189 lb 3.2 oz (85.821 kg)   SpO2: 91% 92%  95%   General:  No distress NECK:  No JVD Lungs:  Clear with deep coughing Heart:  RRR, no rub Neck:  No JVD Abdomen:  Positive bowel sounds, no rebound no guarding Extremities:  No swelling or calf tenderness, right radial site intact  LABS: Lab Results  Component Value Date   TROPONINI 15.62* 01/03/2014   Results for orders placed during the hospital encounter of 01/02/14 (from the past 24 hour(s))  TROPONIN I     Status: Abnormal   Collection Time    01/03/14  8:25 AM      Result Value Ref Range   Troponin I 15.62 (*) <0.30 ng/mL  GLUCOSE, CAPILLARY     Status: Abnormal   Collection Time    01/03/14 12:22 PM      Result Value Ref Range   Glucose-Capillary 263 (*) 70 - 99 mg/dL  POCT ACTIVATED CLOTTING TIME     Status: None   Collection Time    01/03/14  2:32 PM      Result Value Ref Range   Activated Clotting Time 199    GLUCOSE, CAPILLARY     Status: Abnormal   Collection Time    01/03/14  5:26 PM      Result Value Ref Range   Glucose-Capillary 238 (*) 70 - 99 mg/dL  GLUCOSE, CAPILLARY     Status: Abnormal   Collection Time    01/03/14  7:43 PM      Result Value Ref Range   Glucose-Capillary 204 (*) 70 - 99 mg/dL    Intake/Output Summary (Last 24 hours) at 01/04/14 0810 Last data filed at 01/04/14 0600  Gross per 24 hour  Intake 1071.67 ml  Output   2950 ml  Net -1878.33 ml    EKG:  Sinus Tachycardia, rate 107, poor anterior R wave progression with QT prolongation new, T wave inversion more pronounced and now in the inferior and lateral leads.   Inferior ST elevation 1.5 mm.    ASSESSMENT AND PLAN:  Cardiomyopathy:  She went back to the lab yesterday because of ongoing pain.  She was found to have improved TIMI II flow with mild nonobstructive plaque and apical LAD thrombus.  She has a large wall motion defect that is most consistent with Takotsubo.  Continue heparin today.   CAD:  Cath yesterday as above  HTN:  This will be managed in the context of treating the above.    DM:  Continue previous meds with SSI.   Rollene Rotunda 01/04/2014 8:10 AM

## 2014-01-04 NOTE — Progress Notes (Signed)
EKG CRITICAL VALUE     12 lead EKG performed.  Critical value noted.  Monica Becton, RN notified.   Simon Rhein, CCT 01/04/2014 8:10 AM
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
## 2014-01-05 DIAGNOSIS — R079 Chest pain, unspecified: Secondary | ICD-10-CM | POA: Diagnosis not present

## 2014-01-05 DIAGNOSIS — R030 Elevated blood-pressure reading, without diagnosis of hypertension: Secondary | ICD-10-CM

## 2014-01-05 DIAGNOSIS — E785 Hyperlipidemia, unspecified: Secondary | ICD-10-CM

## 2014-01-05 DIAGNOSIS — R42 Dizziness and giddiness: Secondary | ICD-10-CM

## 2014-01-05 DIAGNOSIS — I214 Non-ST elevation (NSTEMI) myocardial infarction: Secondary | ICD-10-CM

## 2014-01-05 LAB — CBC
HCT: 38.4 % (ref 36.0–46.0)
Hemoglobin: 13.1 g/dL (ref 12.0–15.0)
MCH: 30.4 pg (ref 26.0–34.0)
MCHC: 34.1 g/dL (ref 30.0–36.0)
MCV: 89.1 fL (ref 78.0–100.0)
Platelets: 256 10*3/uL (ref 150–400)
RBC: 4.31 MIL/uL (ref 3.87–5.11)
RDW: 13.5 % (ref 11.5–15.5)
WBC: 15.2 10*3/uL — ABNORMAL HIGH (ref 4.0–10.5)

## 2014-01-05 LAB — BASIC METABOLIC PANEL
BUN: 17 mg/dL (ref 6–23)
CALCIUM: 9.5 mg/dL (ref 8.4–10.5)
CHLORIDE: 91 meq/L — AB (ref 96–112)
CO2: 25 meq/L (ref 19–32)
CREATININE: 1.26 mg/dL — AB (ref 0.50–1.10)
GFR calc Af Amer: 57 mL/min — ABNORMAL LOW (ref >90)
GFR calc non Af Amer: 49 mL/min — ABNORMAL LOW (ref >90)
GLUCOSE: 183 mg/dL — AB (ref 70–99)
Potassium: 3.9 mEq/L (ref 3.7–5.3)
Sodium: 132 mEq/L — ABNORMAL LOW (ref 137–147)

## 2014-01-05 LAB — HEPARIN LEVEL (UNFRACTIONATED)
Heparin Unfractionated: 0.21 IU/mL — ABNORMAL LOW (ref 0.30–0.70)
Heparin Unfractionated: 0.25 IU/mL — ABNORMAL LOW (ref 0.30–0.70)

## 2014-01-05 LAB — GLUCOSE, CAPILLARY
GLUCOSE-CAPILLARY: 199 mg/dL — AB (ref 70–99)
GLUCOSE-CAPILLARY: 255 mg/dL — AB (ref 70–99)
GLUCOSE-CAPILLARY: 208 mg/dL — AB (ref 70–99)
Glucose-Capillary: 189 mg/dL — ABNORMAL HIGH (ref 70–99)

## 2014-01-05 MED ORDER — TIZANIDINE HCL 4 MG PO TABS
4.0000 mg | ORAL_TABLET | Freq: Four times a day (QID) | ORAL | Status: DC | PRN
  Filled 2014-01-05: qty 1

## 2014-01-05 MED ORDER — HEPARIN (PORCINE) IN NACL 100-0.45 UNIT/ML-% IJ SOLN
2050.0000 [IU]/h | INTRAMUSCULAR | Status: DC
Start: 2014-01-05 — End: 2014-01-05
  Administered 2014-01-05: 2050 [IU]/h via INTRAVENOUS

## 2014-01-05 MED ORDER — LEVALBUTEROL HCL 0.63 MG/3ML IN NEBU: 0.6300 mg | INHALATION_SOLUTION | Freq: Four times a day (QID) | RESPIRATORY_TRACT | Status: DC | PRN

## 2014-01-05 MED ORDER — GABAPENTIN 400 MG PO CAPS: 400.0000 mg | ORAL_CAPSULE | Freq: Two times a day (BID) | ORAL | Status: DC

## 2014-01-05 MED ORDER — IPRATROPIUM BROMIDE 0.02 % IN SOLN: 0.5000 mg | RESPIRATORY_TRACT | Status: DC | PRN

## 2014-01-05 MED ORDER — ASPIRIN EC 81 MG PO TBEC: 81.0000 mg | DELAYED_RELEASE_TABLET | Freq: Every day | ORAL | Status: DC

## 2014-01-05 MED ORDER — IPRATROPIUM BROMIDE 0.02 % IN SOLN
0.5000 mg | RESPIRATORY_TRACT | Status: DC
  Administered 2014-01-05 (×2): 0.5 mg via RESPIRATORY_TRACT
  Filled 2014-01-05 (×2): qty 2.5

## 2014-01-05 MED ORDER — LEVALBUTEROL HCL 0.63 MG/3ML IN NEBU
0.6300 mg | INHALATION_SOLUTION | Freq: Four times a day (QID) | RESPIRATORY_TRACT | Status: DC
  Administered 2014-01-05 (×2): 0.63 mg via RESPIRATORY_TRACT
  Filled 2014-01-05 (×2): qty 3

## 2014-01-05 NOTE — Progress Notes (Signed)
Inpatient Diabetes Program Recommendations  AACE/ADA: New Consensus Statement on Inpatient Glycemic Control (2013)  Target Ranges:  Prepandial:   less than 140 mg/dL      Peak postprandial:   less than 180 mg/dL (1-2 hours)      Critically ill patients:  140 - 180 mg/dL  Results for Erica Herrera, Erica Herrera (MRN [id number]) as of 01/05/2014 10:46  Ref. Range 01/04/2014 07:47 01/04/2014 12:29 01/04/2014 16:24 01/04/2014 21:54 01/05/2014 07:49  Glucose-Capillary Latest Range: 70-99 mg/dL 096 (H) 438 (H) 381 (H) 203 (H) 199 (H)   Inpatient Diabetes Program Recommendations Insulin - Basal: consider adding low dose basal Lantus or Levemir 10 units  HgbA1C: order to assess prehospital glucose control Thank you  Piedad Climes BSN, RN,CDE Inpatient Diabetes Coordinator [phone] (team pager)

## 2014-01-05 NOTE — Progress Notes (Signed)
ANTICOAGULATION CONSULT NOTE   Pharmacy Consult for heparin Indication: chest pain/ACS / apical thrombus   Allergies  Allergen Reactions  . Imitrex [Sumatriptan Base] Other (See Comments)    hypotension  . Penicillins Shortness Of Breath and Other (See Comments)    Difficulty breathing  . Sulfa Antibiotics Other (See Comments)    Very high fever, muscle spasms  . Adhesive [Tape] Other (See Comments)    unknown    Patient Measurements: Height: 5\' 7"  (170.2 cm) Weight: 187 lb 13.3 oz (85.2 kg) IBW/kg (Calculated) : 61.6 Heparin Dosing Weight: 79.4 kg  Vital Signs: Temp: 98.7 F (37.1 C) (05/18 0746) Temp src: Oral (05/18 0746) BP: 125/63 mmHg (05/18 0746) Pulse Rate: 82 (05/18 0746)  Labs:  Recent Labs  01/03/14 0140 01/03/14 0825  01/04/14 0840 01/04/14 1655 01/05/14 0115 01/05/14 1012  HGB 13.4  --   --  14.0  --  13.1  --   HCT 40.1  --   --  40.5  --  38.4  --   PLT 248  --   --  252  --  256  --   HEPARINUNFRC  --   --   < > <0.10* <0.10* 0.21* 0.25*  CREATININE 0.84  --   --  0.84  --  1.26*  --   TROPONINI 8.18* 15.62*  --  2.22*  --   --   --   < > = values in this interval not displayed.  Estimated Creatinine Clearance: 60.5 ml/min (by C-G formula based on Cr of 1.26).   Assessment: 48 yo female with possible apical thrombus on heparin and heparin level is 0.25 after increase to 1850 units/hr.  Goal of Therapy:  Heparin level 0.3-0.7 units/ml Monitor platelets by anticoagulation protocol: Yes   Plan:  -Increase heparin to 2050 units/hr -Heparin level in 6 hrs -Will follow anticoagulation plans  Harland German, Pharm D 01/05/2014 11:22 AM

## 2014-01-05 NOTE — Progress Notes (Signed)
ANTICOAGULATION CONSULT NOTE   Pharmacy Consult for heparin Indication: chest pain/ACS / apical thrombus   Allergies  Allergen Reactions  . Imitrex [Sumatriptan Base] Other (See Comments)    hypotension  . Penicillins Shortness Of Breath and Other (See Comments)    Difficulty breathing  . Sulfa Antibiotics Other (See Comments)    Very high fever, muscle spasms  . Adhesive [Tape] Other (See Comments)    unknown    Patient Measurements: Height: 5\' 7"  (170.2 cm) Weight: 189 lb 3.2 oz (85.821 kg) IBW/kg (Calculated) : 61.6 Heparin Dosing Weight: 79.4 kg  Vital Signs: Temp: 98.9 F (37.2 C) (05/18 0014) Temp src: Axillary (05/18 0014) BP: 107/62 mmHg (05/18 0014) Pulse Rate: 104 (05/17 2225)  Labs:  Recent Labs  01/03/14 0140 01/03/14 0825 01/04/14 0840 01/04/14 1655 01/05/14 0115  HGB 13.4  --  14.0  --  13.1  HCT 40.1  --  40.5  --  38.4  PLT 248  --  252  --  256  HEPARINUNFRC  --   --  <0.10* <0.10* 0.21*  CREATININE 0.84  --  0.84  --  1.26*  TROPONINI 8.18* 15.62* 2.22*  --   --     Estimated Creatinine Clearance: 60.8 ml/min (by C-G formula based on Cr of 1.26).   Assessment: 48 yo female with apical thrombus for heparin  Goal of Therapy:  Heparin level 0.3-0.7 units/ml Monitor platelets by anticoagulation protocol: Yes   Plan:  Increase Heparin 1850 units/hr Check heparin level in 6 hours.  Geannie Risen, PharmD, BCPS

## 2014-01-05 NOTE — Progress Notes (Signed)
SUBJECTIVE:  Much better this am.  She denies any CP, SOB or palpitations or dizziness.  MEDS:   . aspirin  81 mg Oral Daily  . atorvastatin  80 mg Oral q1800  . buPROPion  150 mg Oral BID  . carvedilol  6.25 mg Oral BID WC  . furosemide  40 mg Intravenous Q12H  . gabapentin  300 mg Oral TID  . hyoscyamine  0.125 mg Sublingual TID AC & HS  . insulin aspart  0-15 Units Subcutaneous TID WC  . insulin aspart  0-5 Units Subcutaneous QHS  . lamoTRIgine  150 mg Oral BID  . lisinopril  5 mg Oral Daily  . nitroGLYCERIN  1 inch Topical 4 times per day  . omega-3 acid ethyl esters  1 g Oral BID  . sertraline  75 mg Oral Daily  . ticagrelor  90 mg Oral BID   PHYSICAL EXAM Filed Vitals:   01/05/14 0014 01/05/14 0257 01/05/14 0300 01/05/14 0746  BP: 107/62  90/55 125/63  Pulse:   80 82  Temp: 98.9 F (37.2 C)  98.7 F (37.1 C) 98.7 F (37.1 C)  TempSrc: Axillary  Oral Oral  Resp: 17  18 20   Height:      Weight:  187 lb 13.3 oz (85.2 kg)    SpO2: 96%  98% 92%   General:  No distress NECK:  No JVD Lungs:  Wheezing - in and exspiratory Heart:  RRR, no rub Neck:  No JVD Abdomen:  Positive bowel sounds, no rebound no guarding Extremities:  No swelling or calf tenderness, right radial site intact  LABS: Lab Results  Component Value Date   TROPONINI 2.22* 01/04/2014   Results for orders placed during the hospital encounter of 01/02/14 (from the past 24 hour(s))  GLUCOSE, CAPILLARY     Status: Abnormal   Collection Time    01/04/14 12:29 PM      Result Value Ref Range   Glucose-Capillary 257 (*) 70 - 99 mg/dL  GLUCOSE, CAPILLARY     Status: Abnormal   Collection Time    01/04/14  4:24 PM      Result Value Ref Range   Glucose-Capillary 227 (*) 70 - 99 mg/dL  HEPARIN LEVEL (UNFRACTIONATED)     Status: Abnormal   Collection Time    01/04/14  4:55 PM      Result Value Ref Range   Heparin Unfractionated <0.10 (*) 0.30 - 0.70 IU/mL  GLUCOSE, CAPILLARY     Status: Abnormal   Collection Time    01/04/14  9:54 PM      Result Value Ref Range   Glucose-Capillary 203 (*) 70 - 99 mg/dL  CBC     Status: Abnormal   Collection Time    01/05/14  1:15 AM      Result Value Ref Range   WBC 15.2 (*) 4.0 - 10.5 K/uL   RBC 4.31  3.87 - 5.11 MIL/uL   Hemoglobin 13.1  12.0 - 15.0 g/dL   HCT 15.7  26.2 - 03.5 %   MCV 89.1  78.0 - 100.0 fL   MCH 30.4  26.0 - 34.0 pg   MCHC 34.1  30.0 - 36.0 g/dL   RDW 59.7  41.6 - 38.4 %   Platelets 256  150 - 400 K/uL  HEPARIN LEVEL (UNFRACTIONATED)     Status: Abnormal   Collection Time    01/05/14  1:15 AM      Result Value Ref Range  Heparin Unfractionated 0.21 (*) 0.30 - 0.70 IU/mL  BASIC METABOLIC PANEL     Status: Abnormal   Collection Time    01/05/14  1:15 AM      Result Value Ref Range   Sodium 132 (*) 137 - 147 mEq/L   Potassium 3.9  3.7 - 5.3 mEq/L   Chloride 91 (*) 96 - 112 mEq/L   CO2 25  19 - 32 mEq/L   Glucose, Bld 183 (*) 70 - 99 mg/dL   BUN 17  6 - 23 mg/dL   Creatinine, Ser 1.611.26 (*) 0.50 - 1.10 mg/dL   Calcium 9.5  8.4 - 09.610.5 mg/dL   GFR calc non Af Amer 49 (*) >90 mL/min   GFR calc Af Amer 57 (*) >90 mL/min  GLUCOSE, CAPILLARY     Status: Abnormal   Collection Time    01/05/14  7:49 AM      Result Value Ref Range   Glucose-Capillary 199 (*) 70 - 99 mg/dL  HEPARIN LEVEL (UNFRACTIONATED)     Status: Abnormal   Collection Time    01/05/14 10:12 AM      Result Value Ref Range   Heparin Unfractionated 0.25 (*) 0.30 - 0.70 IU/mL    Intake/Output Summary (Last 24 hours) at 01/05/14 1154 Last data filed at 01/05/14 1000  Gross per 24 hour  Intake 1315.09 ml  Output   4100 ml  Net -2784.91 ml    EKG:  Sinus Tachycardia, rate 107, poor anterior R wave progression with QT prolongation new, diffuse profound T wave inversion, Q in the inferior leads with inferior ST elevation 1.5 mm.     ASSESSMENT AND PLAN:  1. Cardiomyopathy:  Cath showed non-obstructive disease in the RCA or Circumflex, mild luminal  irregularities in the mid LAD with no evidence of plaque rupture, dye staining or dissection and occlusion of very small distal LAD after it wraps around the apex, likely representing thrombus but too small for PCI. She has been on heparin since the cath on 01/02/2014.  She has a large wall motion defect that is most consistent with Takotsubo.    We will discontinue heparin and continue dual antiplatelet therapy, as well as ACEI and BB for the cardiomyopathy.  2. HTN:  This will be managed in the context of treating the above.    3. DM:  Continue previous meds with SSI.   4. Asthma - actively wheezing - start atrovent and xopenex  We will transfer to the tele and if stable d/c tomorrow.   Lars MassonKatarina H Yannis Gumbs 01/05/2014 11:54 AM

## 2014-01-05 NOTE — Care Management Note (Addendum)
    Page 1 of 1   01/06/2014     12:15:26 PM CARE MANAGEMENT NOTE 01/06/2014  Patient:  Erica Herrera, Erica Herrera   Account Number:  [id number]  Date Initiated:  01/05/2014  Documentation initiated by:  Junius Creamer  Subjective/Objective Assessment:   adm w mi     Action/Plan:   lives w husband, pcp has moved to different area and pt looking for new pcp   Anticipated DC Date:     Anticipated DC Plan:           [facility]  DURABLE MEDICAL EQUIPMENT   Choice offered to / List presented to:  C-1 Patient   DME arranged  VEST - LIFE VEST      DME agency  OTHER - SEE NOTE        Status of service:  Completed, signed off Medicare Important Message given?   (If response is "NO", the following Medicare IM given date fields will be blank) Date Medicare IM given:   Date Additional Medicare IM given:    Discharge Disposition:  HOME/SELF CARE  Per UR Regulation:  Reviewed for med. necessity/level of care/duration of stay  If discussed at Long Length of Stay Meetings, dates discussed:    Comments:  01-06-14 1154 Tomi Bamberger, RN,BSN [phone] CM did call [facility] in [place] and medication brilinta is available. Co pay will be 3.60. Pt in need of Life vest. [employer] Liaison is aware and working to see if insurance will approve life vest. If so pt to be fit later today. CM did call [facility] and was not able to get an appointment due to pt has Medicaid 2ndary and office will call pt back to see which MD is listed on card -if none listed then plan is to accept pt at office. No further needs from CM at this time.   5/18 1507 debbie dowell rn,bsn left pt 30day free brilinta card. pt has medicaid and left medicaid prior auth for brilinta on shadow chart. left phone number for [facility] for pt to call to see if she can find new pcp.

## 2014-01-05 NOTE — Progress Notes (Signed)
CARDIAC REHAB PHASE I   PRE:  Rate/Rhythm: 90 SR  BP:  Supine:   Sitting: 101/38  Standing:    SaO2:   MODE:  Ambulation: 700 ft   POST:  Rate/Rhythm: 113 ST  BP:  Supine:   Sitting: 106/69  Standing:    SaO2:  1030-1155 Pt tolerated ambulation well without c/o of cp or SOB. VS stable. Completed MI education with pt. We discussed smoking cessation. I gave her tips for quitting, coaching contact number and quit smart class information. Pt states that she has tried to quit many times before and has used multiple things without success. I have encouraged her to keep trying. She does voice that she wants and knows she needs to quit. Pt states that she has not taken any of her psychiatric medications or Metformin for the past two months due to her primary care physician moving to [facility] and she had been unable to find A PCP that would accept her Medicare. She also admits that she was going to move out of state, but now plans to stay in [place]. Will ask case manager to see pt. Pt agrees to [facility] in [place], will send referral. She has a [employer] service that will get her to appointments.  Melina Copa RN 01/05/2014 11:51 AM  '

## 2014-01-06 LAB — CBC
HCT: 35.4 % — ABNORMAL LOW (ref 36.0–46.0)
Hemoglobin: 11.9 g/dL — ABNORMAL LOW (ref 12.0–15.0)
MCH: 29.8 pg (ref 26.0–34.0)
MCHC: 33.6 g/dL (ref 30.0–36.0)
MCV: 88.7 fL (ref 78.0–100.0)
Platelets: 269 10*3/uL (ref 150–400)
RBC: 3.99 MIL/uL (ref 3.87–5.11)
RDW: 13.6 % (ref 11.5–15.5)
WBC: 13.5 10*3/uL — ABNORMAL HIGH (ref 4.0–10.5)

## 2014-01-06 LAB — GLUCOSE, CAPILLARY
GLUCOSE-CAPILLARY: 200 mg/dL — AB (ref 70–99)
Glucose-Capillary: 185 mg/dL — ABNORMAL HIGH (ref 70–99)
Glucose-Capillary: 166 mg/dL — ABNORMAL HIGH (ref 70–99)

## 2014-01-06 LAB — HEMOGLOBIN A1C
Hgb A1c MFr Bld: 8.6 % — ABNORMAL HIGH (ref <5.7)
Mean Plasma Glucose: 200 mg/dL — ABNORMAL HIGH (ref <117)

## 2014-01-06 MED ORDER — CARVEDILOL 6.25 MG PO TABS: 6.2500 mg | ORAL_TABLET | Freq: Two times a day (BID) | ORAL | Status: DC

## 2014-01-06 MED ORDER — FUROSEMIDE 40 MG PO TABS
40.0000 mg | ORAL_TABLET | Freq: Every day | ORAL | Status: DC
  Filled 2014-01-06: qty 1

## 2014-01-06 MED ORDER — ATORVASTATIN CALCIUM 80 MG PO TABS: 80.0000 mg | ORAL_TABLET | Freq: Every day | ORAL | Status: DC

## 2014-01-06 MED ORDER — NITROGLYCERIN 0.4 MG SL SUBL: 0.4000 mg | SUBLINGUAL_TABLET | SUBLINGUAL | Status: DC | PRN

## 2014-01-06 MED ORDER — TICAGRELOR 90 MG PO TABS: 90.0000 mg | ORAL_TABLET | Freq: Two times a day (BID) | ORAL | Status: DC

## 2014-01-06 MED ORDER — FUROSEMIDE 40 MG PO TABS: 40.0000 mg | ORAL_TABLET | Freq: Every day | ORAL | Status: DC

## 2014-01-06 MED ORDER — LISINOPRIL 5 MG PO TABS: 5.0000 mg | ORAL_TABLET | Freq: Every day | ORAL | Status: DC
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
## 2014-01-06 NOTE — Care Management (Signed)
1216 01-06-14 Medicare IM signed by patient and signed copy left on chart. Lamar [place], RN,BSN [phone]    '

## 2014-01-06 NOTE — Discharge Instructions (Addendum)
PLEASE REMEMBER TO BRING ALL OF YOUR MEDICATIONS TO EACH OF YOUR FOLLOW-UP OFFICE VISITS.  PLEASE ATTEND ALL SCHEDULED FOLLOW-UP APPOINTMENTS.   Activity: Increase activity slowly as tolerated. You may shower, but no soaking baths (or swimming) for 1 week. No driving for 1 week. No lifting over 5 lbs for 2 weeks. No sexual activity for 1 week.   You May Return to Work: in 3 weeks (if applicable)  Wound Care: You may wash cath site gently with soap and water. Keep cath site clean and dry. If you notice pain, swelling, bleeding or pus at your cath site, please call [phone].    Cardiac Cath Site Care Refer to this sheet in the next few weeks. These instructions provide you with information on caring for yourself after your procedure. Your caregiver may also give you more specific instructions. Your treatment has been planned according to current medical practices, but problems sometimes occur. Call your caregiver if you have any problems or questions after your procedure. HOME CARE INSTRUCTIONS  You may shower 24 hours after the procedure. Remove the bandage (dressing) and gently wash the site with plain soap and water. Gently pat the site dry.   Do not apply powder or lotion to the site.   Do not sit in a bathtub, swimming pool, or whirlpool for 5 to 7 days.   No bending, squatting, or lifting anything over 10 pounds (4.5 kg) as directed by your caregiver.   Inspect the site at least twice daily.   Do not drive home if you are discharged the same day of the procedure. Have someone else drive you.   You may drive 24 hours after the procedure unless otherwise instructed by your caregiver.  What to expect:  Any bruising will usually fade within 1 to 2 weeks.   Blood that collects in the tissue (hematoma) may be painful to the touch. It should usually decrease in size and tenderness within 1 to 2 weeks.  SEEK IMMEDIATE MEDICAL CARE IF:  You have unusual pain at the site or down the  affected limb.   You have redness, warmth, swelling, or pain at the site.   You have drainage (other than a small amount of blood on the dressing).   You have chills.   You have a fever or persistent symptoms for more than 72 hours.   You have a fever and your symptoms suddenly get worse.   Your leg becomes pale, cool, tingly, or numb.   You have heavy bleeding from the site. Hold pressure on the site.  Document Released: 09/09/2010 Document Revised: 07/27/2011 Document Reviewed:    Heart Attack in Women Heart attack (myocardial infarction) is one of the leading causes of sudden, unexpected death in women. A heart attack is damage to the heart that is not reversible. A heart attack usually occurs when a heart (coronary) artery becomes narrowed or blocked. The blockage cuts off blood supply to the heart muscle. When one or more of the coronary arteries becomes blocked, that area of the heart begins to die. This can cause pain felt during a heart attack.  If you think you might be having a heart attack, do not ignore your symptoms. Call your local emergency services (911 in [place] immediately. It is recommended that you take a 162 mg non-enteric coated aspirin if you do not have an aspirin allergy. Do not drive yourself to the hospital or wait to see if your symptoms go away. Early recognition of heart  attack symptoms is critical. The sooner a heart attack is treated, the greater the amount of heart muscle saved. Time is muscle. It can save your life. CAUSES  A heart attack can occur from coronary artery disease (CAD). CAD is a process in which the coronary arteries narrow or become blocked from the development of atherosclerosis. Atherosclerosis is a disease in which plaque builds up on the inside of the coronary arteries. Plaque is made up of fats (lipids), cholesterol, calcium, and fibrous tissue. A heart attack can occur due to:  Plaque buildup that can severely narrow or block the coronary  arteries and diminish blood flow.  Plaque that can become unstable and "rupture." Unstable plaque that ruptures within a coronary artery can form a clot and cause a sudden (acute) blockage of the coronary artery.  A severe tightening (spasm) of the coronary artery. This is a less common cause of a heart attack. When a coronary artery spasms, it cuts off blood flow through the coronary artery. Spasms can occur in coronary arteries that do not have atherosclerosis. RISK FACTORS In women, as the level of estrogen in the blood decreases after menopause, the risk of a heart attack increases. Other risk factors of heart attack in women include:  High blood pressure.  High cholesterol levels.  Menopause.  Smoking.  Obesity.  Diabetes.  Hysterectomy.  Previous heart attack.  Lack of regular exercise.  Family history of heart attacks. SYMPTOMS  In women, heart attack symptoms may be different than those in men. Women may not experience the typical chest discomfort or pain, which is considered the primary heart attack symptom in men. Women may describe a feeling of pressure, ache, or tightness in the chest. Women may experience new or different physical symptoms 1 month or more before a heart attack. Unusual, unexplained fatigue may be the most frequently identified symptom. Sleep disturbances and weakness in the arms may also be considered warning signs.  Other heart attack symptoms that may occur more often in women are:  Unexplained feelings of nervousness or anxiety.  Discomfort between the shoulder blades.  Tingling in the hands and arms.  Swollen arms.  Headaches. Heart attack symptoms for both men and women include:  Pain or discomfort spreading to the neck, shoulder, arm, or jaw.  Shortness of breath.  Sudden cold sweats.  Pain or discomfort in the abdomen.  Heartburn or indigestion with or without vomiting.  Sudden lightheadedness.  Sudden fainting or  blackout. PREVENTION The following healthy lifestyle habits may help decrease your risk of heart attacks:  Quitting smoking.  Keeping your blood pressure, blood sugar, and cholesterol levels within normal limits.  Maintaining a healthy weight.  Staying physically active and exercising regularly.  Decreasing your salt intake.  Eating a diet low in saturated fats and cholesterol.  Increasing your fiber intake by including whole grains, vegetables, and fruits in your diet.  Avoiding situations that cause stress, anger, or depression.  Taking medicine as advised by your caregiver. SEEK IMMEDIATE MEDICAL CARE IF:   You have severe chest pain, especially if the pain is crushing or pressure-like and spreads to the arms, back, neck, or jaw. This is an emergency. Do not wait to see if the pain will go away. Call your local emergency services (911 in [place] immediately. Do not drive yourself to the hospital.  You develop shortness of breath during rest, sleep, or with activity.  You have sudden, unexplained sweating or clammy skin.  You feel nauseous or vomit  without cause.  You become lightheaded or dizzy.  You feel your heart beating rapidly or you notice your heart "skipping" beats. MAKE SURE YOU:   Understand these instructions.  Will watch your condition.  Will get help right away if you are not doing well or get worse. Document Released: 02/03/2008 Document Revised: 02/06/2012 Document Reviewed: 11/09/2011 [facility] Patient Information 2014 [place], [place].  Cardiac Diet This diet can help prevent heart disease and stroke. Many factors influence your heart health, including eating and exercise habits. Coronary risk rises a lot with abnormal blood fat (lipid) levels. Cardiac meal planning includes limiting unhealthy fats, increasing healthy fats, and making other small dietary changes. General guidelines are as follows:  Adjust calorie intake to reach and maintain desirable  body weight.  Limit total fat intake to less than 30% of total calories. Saturated fat should be less than 7% of calories.  Saturated fats are found in animal products and in some vegetable products. Saturated vegetable fats are found in coconut oil, cocoa butter, palm oil, and palm kernel oil. Read labels carefully to avoid these products as much as possible. Use butter in moderation. Choose tub margarines and oils that have 2 grams of fat or less. Good cooking oils are canola and olive oils.  Practice low-fat cooking techniques. Do not fry food. Instead, broil, bake, boil, steam, grill, roast on a rack, stir-fry, or microwave it. Other fat reducing suggestions include:  Remove the skin from poultry.  Remove all visible fat from meats.  Skim the fat off stews, soups, and gravies before serving them.  Steam vegetables in water or broth instead of sauting them in fat.  Avoid foods with trans fat (or hydrogenated oils), such as commercially fried foods and commercially baked goods. Commercial shortening and deep-frying fats will contain trans fat.  Increase intake of fruits, vegetables, whole grains, and legumes to replace foods high in fat.  Increase consumption of nuts, legumes, and seeds to at least 4 servings weekly. One serving of a legume equals  cup, and 1 serving of nuts or seeds equals  cup.  Choose whole grains more often. Have 3 servings per day (a serving is 1 ounce [oz]).  Eat 4 to 5 servings of vegetables per day. A serving of vegetables is 1 cup of raw leafy vegetables;  cup of raw or cooked cut-up vegetables;  cup of vegetable juice.  Eat 4 to 5 servings of fruit per day. A serving of fruit is 1 medium whole fruit;  cup of dried fruit;  cup of fresh, frozen, or canned fruit;  cup of 100% fruit juice.  Increase your intake of dietary fiber to 20 to 30 grams per day. Insoluble fiber may help lower your risk of heart disease and may help curb your appetite.  Soluble  fiber binds cholesterol to be removed from the blood. Foods high in soluble fiber are dried beans, citrus fruits, oats, apples, bananas, broccoli, Brussels sprouts, and eggplant.  Try to include foods fortified with plant sterols or stanols, such as yogurt, breads, juices, or margarines. Choose several fortified foods to achieve a daily intake of 2 to 3 grams of plant sterols or stanols.  Foods with omega-3 fats can help reduce your risk of heart disease. Aim to have a 3.5 oz portion of fatty fish twice per week, such as salmon, mackerel, albacore tuna, sardines, lake trout, or herring. If you wish to take a fish oil supplement, choose one that contains 1 gram of both DHA  and EPA.  Limit processed meats to 2 servings (3 oz portion) weekly.  Limit the sodium in your diet to 1500 milligrams (mg) per day. If you have high blood pressure, talk to a registered dietitian about a DASH (Dietary Approaches to Stop Hypertension) eating plan.  Limit sweets and beverages with added sugar, such as soda, to no more than 5 servings per week. One serving is:   1 tablespoon sugar.  1 tablespoon jelly or jam.   cup sorbet.  1 cup lemonade.   cup regular soda. CHOOSING FOODS Starches  Allowed: Breads: All kinds (wheat, rye, raisin, white, oatmeal, [place], [place], and English muffin bread). Low-fat rolls: English muffins, frankfurter and hamburger buns, bagels, pita bread, tortillas (not fried). Pancakes, waffles, biscuits, and muffins made with recommended oil.  Avoid: Products made with saturated or trans fats, oils, or whole milk products. Butter rolls, cheese breads, croissants. Commercial doughnuts, muffins, sweet rolls, biscuits, waffles, pancakes, store-bought mixes. Crackers  Allowed: Low-fat crackers and snacks: Animal, graham, rye, saltine (with recommended oil, no lard), oyster, and matzo crackers. Bread sticks, melba toast, rusks, flatbread, pretzels, and light popcorn.  Avoid: High-fat  crackers: cheese crackers, butter crackers, and those made with coconut, palm oil, or trans fat (hydrogenated oils). Buttered popcorn. Cereals  Allowed: Hot or cold whole-grain cereals.  Avoid: Cereals containing coconut, hydrogenated vegetable fat, or animal fat. Potatoes / Pasta / Rice  Allowed: All kinds of potatoes, rice, and pasta (such as macaroni, spaghetti, and noodles).  Avoid: Pasta or rice prepared with cream sauce or high-fat cheese. Chow mein noodles, [place] fries. Vegetables  Allowed: All vegetables and vegetable juices.  Avoid: Fried vegetables. Vegetables in cream, butter, or high-fat cheese sauces. Limit coconut. Fruit in cream or custard. Protein  Allowed: Limit your intake of meat, seafood, and poultry to no more than 6 oz (cooked weight) per day. All lean, well-trimmed beef, veal, pork, and lamb. All chicken and [place] without skin. All fish and shellfish. Wild game: wild duck, rabbit, pheasant, and venison. Egg whites or low-cholesterol egg substitutes may be used as desired. Meatless dishes: recipes with dried beans, peas, lentils, and tofu (soybean curd). Seeds and nuts: all seeds and most nuts.  Avoid: Prime grade and other heavily marbled and fatty meats, such as short ribs, spare ribs, rib eye roast or steak, frankfurters, sausage, bacon, and high-fat luncheon meats, mutton. Caviar. Commercially fried fish. Domestic duck, goose, venison sausage. Organ meats: liver, gizzard, heart, chitterlings, brains, kidney, sweetbreads. Dairy  Allowed: Low-fat cheeses: nonfat or low-fat cottage cheese (1% or 2% fat), cheeses made with part skim milk, such as mozzarella, farmers, string, or ricotta. (Cheeses should be labeled no more than 2 to 6 grams fat per oz.). Skim (or 1%) milk: liquid, powdered, or evaporated. Buttermilk made with low-fat milk. Drinks made with skim or low-fat milk or cocoa. Chocolate milk or cocoa made with skim or low-fat (1%) milk. Nonfat or low-fat  yogurt.  Avoid: Whole milk cheeses, including colby, cheddar, mu[place], [address], [place], [place]rie, [place]ember[place], [address], Swiss, and blue. Creamed cottage cheese, cream cheese. Whole milk and whole milk products, including buttermilk or yogurt made from whole milk, drinks made from whole milk. Condensed milk, evaporated whole milk, and 2% milk. Soups and Combination Foods  Allowed: Low-fat low-sodium soups: broth, dehydrated soups, homemade broth, soups with the fat removed, homemade cream soups made with skim or low-fat milk. Low-fat spaghetti, lasagna, chili, and Spanish rice if low-fat ingredients and low-fat cooking techniques are used.  Avoid: Cream soups made with whole milk, cream, or high-fat cheese. All other soups. Desserts and Sweets  Allowed: Sherbet, fruit ices, gelatins, meringues, and angel food cake. Homemade desserts with recommended fats, oils, and milk products. Jam, jelly, honey, marmalade, sugars, and syrups. Pure sugar candy, such as gum drops, hard candy, jelly beans, marshmallows, mints, and small amounts of dark chocolate.  Avoid: Commercially prepared cakes, pies, cookies, frosting, pudding, or mixes for these products. Desserts containing whole milk products, chocolate, coconut, lard, palm oil, or palm kernel oil. Ice cream or ice cream drinks. Candy that contains chocolate, coconut, butter, hydrogenated fat, or unknown ingredients. Buttered syrups. Fats and Oils  Allowed: Vegetable oils: safflower, sunflower, corn, soybean, cottonseed, sesame, canola, olive, or peanut. Non-hydrogenated margarines. Salad dressing or mayonnaise: homemade or commercial, made with a recommended oil. Low or nonfat salad dressing or mayonnaise.  Limit added fats and oils to 6 to 8 tsp per day (includes fats used in cooking, baking, salads, and spreads on bread). Remember to count the "hidden fats" in foods.  Avoid: Solid fats and shortenings: butter, lard, salt pork, bacon drippings. Gravy  containing meat fat, shortening, or suet. Cocoa butter, coconut. Coconut oil, palm oil, palm kernel oil, or hydrogenated oils: these ingredients are often used in bakery products, nondairy creamers, whipped toppings, candy, and commercially fried foods. Read labels carefully. Salad dressings made of unknown oils, sour cream, or cheese, such as blue cheese and Roquefort. Cream, all kinds: half-and-half, light, heavy, or whipping. Sour cream or cream cheese (even if "light" or low-fat). Nondairy cream substitutes: coffee creamers and sour cream substitutes made with palm, palm kernel, hydrogenated oils, or coconut oil. Beverages  Allowed: Coffee (regular or decaffeinated), tea. Diet carbonated beverages, mineral water. Alcohol: Check with your caregiver. Moderation is recommended.  Avoid: Whole milk, regular sodas, and juice drinks with added sugar. Condiments  Allowed: All seasonings and condiments. Cocoa powder. "Cream" sauces made with recommended ingredients.  Avoid: Carob powder made with hydrogenated fats. SAMPLE MENU Breakfast   cup orange juice   cup oatmeal  1 slice toast  1 tsp margarine  1 cup skim milk Lunch  [place] sandwich with 2 oz [place], 2 slices bread  Lettuce and tomato slices  Fresh fruit  Carrot sticks  Coffee or tea Snack  Fresh fruit or low-fat crackers Dinner  3 oz lean ground beef  1 baked potato  1 tsp margarine   cup asparagus  Lettuce salad  1 tbs non-creamy dressing   cup peach slices  1 cup skim milk Document Released: 05/16/2008 Document Revised: 02/06/2012 Document Reviewed: 10/31/2011 [employer] Patient Information 2014 [place], [place].

## 2014-01-06 NOTE — Progress Notes (Signed)
Patient Name: Erica Herrera Date of Encounter: 01/06/2014  Principal Problem:   NSTEMI (non-ST elevated myocardial infarction) Active Problems:   Chest pain   Congestive dilated cardiomyopathy   Diabetes mellitus   Tobacco abuse   Takotsubo syndrome    Patient Profile: 48 yo female w/ no previous cardiac hx, hx stress recently plus long travel. CRFs DM, tobacco, dyslipidemia was admitted 05/15 w/ ?STEMI. Takotsubo CM also w/ small distal LAD occlusion  SUBJECTIVE: No chest pain, no SOB, says she thought she had a DVT starting all this after a trip to [place] and back in 24 hours.  OBJECTIVE Filed Vitals:   01/05/14 1800 01/05/14 2100 01/05/14 2303 01/06/14 0500  BP: 100/48 101/58  110/50  Pulse:  69  74  Temp:  98.5 F (36.9 C)  97.3 F (36.3 C)  TempSrc:      Resp:  20  22  Height:  5\' 7"  (1.702 m)    Weight:  191 lb 12.8 oz (87 kg)  192 lb (87.091 kg)  SpO2:  95% 97% 97%    Intake/Output Summary (Last 24 hours) at 01/06/14 0752 Last data filed at 01/06/14 0655  Gross per 24 hour  Intake 2676.93 ml  Output   4100 ml  Net -1423.07 ml   Filed Weights   01/05/14 0257 01/05/14 2100 01/06/14 0500  Weight: 187 lb 13.3 oz (85.2 kg) 191 lb 12.8 oz (87 kg) 192 lb (87.091 kg)    PHYSICAL EXAM General: Well developed, well nourished, female in no acute distress. Head: Normocephalic, atraumatic.  Neck: Supple without bruits, JVD not elevated. Lungs:  Resp regular and unlabored, CTA. Heart: RRR, S1, S2, no S3, S4, soft murmur; no rub. Abdomen: Soft, non-tender, non-distended, BS + x 4.  Extremities: No clubbing, cyanosis, no edema. No calf tenderness Neuro: Alert and oriented X 3. Moves all extremities spontaneously. Psych: Normal affect.  LABS: CBC: Recent Labs  01/05/14 0115 01/06/14 0638  WBC 15.2* 13.5*  HGB 13.1 11.9*  HCT 38.4 35.4*  MCV 89.1 88.7  PLT 256 269   INR:No results found for this basename: INR,  in the last 72 hours Basic Metabolic  Panel: Recent Labs  78/46/9605/17/15 0840 01/05/14 0115  NA 134* 132*  K 3.8 3.9  CL 91* 91*  CO2 24 25  GLUCOSE 273* 183*  BUN 12 17  CREATININE 0.84 1.26*  CALCIUM 9.3 9.5   Cardiac Enzymes: Recent Labs  01/03/14 0825 01/04/14 0840  TROPONINI 15.62* 2.22*   Lab Results  Component Value Date   HGBA1C 6.8* 08/01/2012   Lab Results  Component Value Date   CHOL 202* 08/01/2012   HDL 35* 08/01/2012   LDLCALC 96 08/01/2012   TRIG 355* 08/01/2012   CHOLHDL 5.8 08/01/2012    TELE:  SR, no significant ectopy  Radiology/Studies: No results found.   Current Medications:  . aspirin  81 mg Oral Daily  . atorvastatin  80 mg Oral q1800  . buPROPion  150 mg Oral BID  . carvedilol  6.25 mg Oral BID WC  . furosemide  40 mg Intravenous Q12H  . gabapentin  300 mg Oral TID  . hyoscyamine  0.125 mg Sublingual TID AC & HS  . insulin aspart  0-15 Units Subcutaneous TID WC  . insulin aspart  0-5 Units Subcutaneous QHS  . lamoTRIgine  150 mg Oral BID  . lisinopril  5 mg Oral Daily  . nitroGLYCERIN  1 inch Topical 4 times per  day  . omega-3 acid ethyl esters  1 g Oral BID  . sertraline  75 mg Oral Daily  . ticagrelor  90 mg Oral BID      ASSESSMENT AND PLAN: Principal Problem:   NSTEMI (non-ST elevated myocardial infarction) - per Dr. Lindaann Slough note 05/18 : Cath showed non-obstructive disease in the RCA or Circumflex, mild luminal irregularities in the mid LAD with no evidence of plaque rupture, dye staining or dissection and occlusion of very small distal LAD after it wraps around the apex, likely representing thrombus but too small for PCI. She has been on heparin since the cath on 01/02/2014. She has a large wall motion defect that is most consistent with Takotsubo.    She is on ASA, BB, statin, ACE. Will d/c NTG paste. PM dose of Coreg held due to hypotension. If NTG d/c'd and Lasix changed to po, think BP will tolerated current rx.  Active Problems:   Congestive dilated  cardiomyopathy - change Lasix to po, on ACE, BB, pt to get scales for daily weights.     Diabetes mellitus - not on rx PTA, ck A1c    Tobacco abuse - cessation encouraged    Takotsubo syndrome - see above.    Concern for DVT - will ck Dopplers  Plan - d/c later today if Dopplers negative.  Signed, Darrol Jump , PA-C 7:52 AM 01/06/2014   Agree with note by Theodore Demark PA-C  OK for D/C home with LifeVest. 2D in 3 months  Runell Gess, M.D., FACP, [facility], [facility] [address] [place], [place]  [address]  [phone] 01/06/2014 2:51 PM

## 2014-01-06 NOTE — Progress Notes (Signed)
Bilateral lower extremity venous duplex:  No evidence of DVT, superficial thrombosis, or Baker's Cyst.   

## 2014-01-06 NOTE — Discharge Summary (Signed)
CARDIOLOGY DISCHARGE SUMMARY   Patient ID: Erica JulianLorelei J Slawson MRN: [id number] DOB/AGE: 50/04/1966 50 y.o.  Admit date: 01/02/2014 Discharge date: 01/06/2014  PCP: Milinda AntisURHAM, KAWANTA, MD Primary Cardiologist: Dr. Clifton JamesMcAlhany  Primary Discharge Diagnosis:   NSTEMI (non-ST elevated myocardial infarction)  Secondary Discharge Diagnosis:    Congestive dilated cardiomyopathy   Diabetes mellitus   Tobacco abuse   Takotsubo syndrome  Procedure Performed:  1. Left Heart Catheterization 2. Selective Coronary Angiography 3. 2-D echocardiogram 4. Lower extremity venous Dopplers  Hospital Course: Erica Herrera is a 48 y.o. female with no history of CAD. She was admitted on 05/15 with ongoing chest pain and an abnormal ECG.  Her cardiac enzymes elevated, peaking at 15.62. However, on cardiac catheterization, she had no significant disease except and apical LAD with a filling defect, likely thrombus. Her EF was 25-30% with wall motion abnormalities consistent with Takotsubo cardiomyopathy. Her left ventricular dysfunction was out of proportion to her coronary artery disease. She is felt to have primarily a Takotsubo cardiomyopathy.  She was started on medications for her MI, including aspirin, Plavix, carvedilol and lisinopril. A 2-D echocardiogram was performed, results are below. It reflected her left ventricular dysfunction and this will be followed as an outpatient. A LifeVest was recommended and applied prior to discharge. She will follow up in the office for this. If her EF does not improve in 3 months, and ICD will be considered.  She had been on nitroglycerin paste and required IV Lasix for diuresis as she had some by mother regular. As her volume status improved, her blood pressure decreased so she was taken off the nitrates. She is currently tolerating a low dose of a beta blocker and an ACE inhibitor as well as oral Lasix.  She was started on a statin. She was on a diabetic diet and her  blood sugars were managed with sliding scale insulin. Her hemoglobin A1c was checked and is elevated at 8.6. Compliance with a diabetic diet is encouraged and she is to follow up with her primary care physician.  She was seen by the heart failure RN and educated on managing heart failure. She was seen by cardiac rehabilitation and educated on MI restrictions, cardiac risk factor reduction and exercise guidelines.  [place] and stated that she had not soft very much. She was concerned about a DVT so lower extremity Dopplers were performed that showed no DVT, superficial thrombosis or Baker's cyst.  On 05/19, she was seen by Dr. Allyson SabalBerry and all data were reviewed. Once a LifeVest was applied, no further inpatient workup is indicated. She is considered stable for discharge, to followup as an outpatient.  Labs:   Lab Results  Component Value Date   WBC 13.5* 01/06/2014   HGB 11.9* 01/06/2014   HCT 35.4* 01/06/2014   MCV 88.7 01/06/2014   PLT 269 01/06/2014    Recent Labs Lab 01/02/14 2040  01/05/14 0115  NA 136*  < > 132*  K 4.4  < > 3.9  CL 99  < > 91*  CO2 21  < > 25  BUN 6  < > 17  CREATININE 0.76  < > 1.26*  CALCIUM 8.9  < > 9.5  PROT 7.1  --   --   BILITOT 0.3  --   --   ALKPHOS 111  --   --   ALT 16  --   --   AST 26  --   --  GLUCOSE 171*  < > 183*  < > = values in this interval not displayed.  Recent Labs  01/04/14 0840  TROPONINI 2.22*   Lab Results  Component Value Date   HGBA1C 8.6* 01/06/2014      Radiology: Dg Chest Port 1 View  01/03/2014   CLINICAL DATA:  Shortness of breath and cough  EXAM: PORTABLE CHEST - 1 VIEW  COMPARISON:  DG CHEST 1V PORT dated 01/02/2014  FINDINGS: The heart size and mediastinal contours are within normal limits. The right lung is clear. The visualized skeletal structures are unremarkable. Cervical fusion hardware noted. Minimal patchy left lower lobe airspace opacity is identified.  IMPRESSION: Minimal  patchy left lower lobe airspace opacity which could represent atelectasis although early pneumonia could appear similar. If symptoms persist, consider PA and lateral chest radiographs obtained at full inspiration when the patient is clinically able.   Electronically Signed   By: Christiana Pellant M.D.   On: 01/03/2014 19:13    Cardiac Cath: 01/03/2014 Left main: No obstructive disease.  Left Anterior Descending Artery: Large caliber vessel that courses to the apex with brisk, TIMI-3 flow. The mid vessel has luminal irregularities with no more than 10% stenosis in two areas. There is no dye staining in the mid vessel. The very small caliber (0.75 mm) apical LAD has a filling defect beyond the apex, likely representing small thrombus. This vessel is too small for PCI or stenting and only supplies a very small portion of her myocardium. The diagonal branch is small to moderate in caliber with proximal 10% stenosis.  Circumflex Artery: Large caliber vessel with three moderate caliber obtuse marginal branches. No obstructive disease.  Right Coronary Artery: Moderate caliber non-dominant vessel with no obstructive disease.  Left Ventricular Angiogram: Deferred.  Impression:  1. No obstructive disease in the RCA or Circumflex  2. Mild luminal irregularities in the mid LAD with no evidence of plaque rupture, dye staining or dissection  3. Persistent occlusion of very small distal LAD after it wraps around the apex, likely representing thrombus but too small for PCI  4. Severe segmental LV systolic dysfunction.  5. Her clinical picture is confusing but likely represents resolved thrombus in the LAD with distal embolization resulting in segmental LV dysfunction, elevated troponin and chest pain. No indication for PCI. Her LV gram appears consistent with Takotsubo's cardiomyopathy. The degree of LV dysfunction is hard to explain by the possibility of resolved thrombus alone although if there was proximal or mid LAD  thrombus prior to cath last night, this could be a possibility.  Recommendations: Will change Plavix to Brilinta. Will continue ASA. Will resume heparin drip tonight after sheath pull and continue x 24 more hours. Treatment of systolic dysfunction with beta blocker, Ace-inh. Continue statin. IV Lasix for further diuresis.   EKG: 04-Jan-2014 08:03:02  Sinus tachycardia Inferior infarct , possibly acute Anterior infarct , age undetermined Marked T wave abnormality, consider lateral ischemia increased since prior tracing. Prolonged QT ** ** ACUTE MI / STEMI ** ** Vent. rate 107 BPM PR interval 118 ms QRS duration 82 ms QT/QTc 440/587 ms P-R-T axes 46 12 173  Echo: See paper chart  FOLLOW UP PLANS AND APPOINTMENTS Allergies  Allergen Reactions  . Imitrex [Sumatriptan Base] Other (See Comments)    hypotension  . Penicillins Shortness Of Breath and Other (See Comments)    Difficulty breathing  . Sulfa Antibiotics Other (See Comments)    Very high fever, muscle spasms  . Adhesive [Tape]  Other (See Comments)    unknown     Medication List         aspirin EC 81 MG tablet  Take 81 mg by mouth daily.     atorvastatin 80 MG tablet  Commonly known as:  LIPITOR  Take 1 tablet (80 mg total) by mouth daily at 6 PM.     carvedilol 6.25 MG tablet  Commonly known as:  COREG  Take 1 tablet (6.25 mg total) by mouth 2 (two) times daily with a meal.     furosemide 40 MG tablet  Commonly known as:  LASIX  Take 1 tablet (40 mg total) by mouth daily.     gabapentin 400 MG capsule  Commonly known as:  NEURONTIN  Take 400 mg by mouth 2 (two) times daily.     HYDROmorphone 4 MG tablet  Commonly known as:  DILAUDID  Take 4 mg by mouth 4 (four) times daily as needed for moderate pain.     lisinopril 5 MG tablet  Commonly known as:  PRINIVIL,ZESTRIL  Take 1 tablet (5 mg total) by mouth daily.     nitroGLYCERIN 0.4 MG SL tablet  Commonly known as:  NITROSTAT  Place 1 tablet (0.4 mg  total) under the tongue every 5 (five) minutes as needed for chest pain.     ticagrelor 90 MG Tabs tablet  Commonly known as:  BRILINTA  Take 1 tablet (90 mg total) by mouth 2 (two) times daily.     tiZANidine 4 MG tablet  Commonly known as:  ZANAFLEX  Take 4 mg by mouth every 6 (six) hours as needed for muscle spasms.        Discharge Instructions   Amb Referral to Cardiac Rehabilitation    Complete by:  As directed      Diet - low sodium heart healthy    Complete by:  As directed      Diet Carb Modified    Complete by:  As directed      Increase activity slowly    Complete by:  As directed           Follow-up Information   Follow up with Tereso Newcomer, PA-C On 01/28/2014. (at 2:00 pm)    Specialty:  Physician Assistant   Contact information:   [address] [place] [address] [phone]       BRING ALL MEDICATIONS WITH YOU TO FOLLOW UP APPOINTMENTS  Time spent with patient to include physician time: 53 min Signed: Darrol Jump, PA-C 01/06/2014, 6:31 PM Co-Sign MD

## 2014-01-06 NOTE — Progress Notes (Addendum)
Subjective:  Feels good s/p Takasubo Syndrome  Objective:  Temp:  [97.3 F (36.3 C)-98.6 F (37 C)] 97.3 F (36.3 C) (05/19 0500) Pulse Rate:  [69-94] 85 (05/19 0841) Resp:  [18-22] 22 (05/19 0500) BP: (80-110)/(45-58) 107/48 mmHg (05/19 0841) SpO2:  [93 %-97 %] 97 % (05/19 0500) Weight:  [191 lb 12.8 oz (87 kg)-192 lb (87.091 kg)] 192 lb (87.091 kg) (05/19 0500) Weight change: 3 lb 15.5 oz (1.8 kg)  Intake/Output from previous day: 05/18 0701 - 05/19 0700 In: 2676.9 [P.O.:2564; I.V.:112.9] Out: 4100 [Urine:4100]  Intake/Output from this shift: Total I/O In: 360 [P.O.:360] Out: -   Physical Exam: General appearance: alert and no distress Neck: no adenopathy, no carotid bruit, no JVD, supple, symmetrical, trachea midline and thyroid not enlarged, symmetric, no tenderness/mass/nodules Lungs: clear to auscultation bilaterally Heart: regular rate and rhythm, S1, S2 normal, no murmur, click, rub or gallop Extremities: extremities normal, atraumatic, no cyanosis or edema and Right radial puncture site OK  Lab Results: Results for orders placed during the hospital encounter of 01/02/14 (from the past 48 hour(s))  GLUCOSE, CAPILLARY     Status: Abnormal   Collection Time    01/04/14 12:29 PM      Result Value Ref Range   Glucose-Capillary 257 (*) 70 - 99 mg/dL  GLUCOSE, CAPILLARY     Status: Abnormal   Collection Time    01/04/14  4:24 PM      Result Value Ref Range   Glucose-Capillary 227 (*) 70 - 99 mg/dL  HEPARIN LEVEL (UNFRACTIONATED)     Status: Abnormal   Collection Time    01/04/14  4:55 PM      Result Value Ref Range   Heparin Unfractionated <0.10 (*) 0.30 - 0.70 IU/mL   Comment:            IF HEPARIN RESULTS ARE BELOW     EXPECTED VALUES, AND PATIENT     DOSAGE HAS BEEN CONFIRMED,     SUGGEST FOLLOW UP TESTING     OF ANTITHROMBIN III LEVELS.  GLUCOSE, CAPILLARY     Status: Abnormal   Collection Time    01/04/14  9:54 PM      Result Value Ref Range     Glucose-Capillary 203 (*) 70 - 99 mg/dL  CBC     Status: Abnormal   Collection Time    01/05/14  1:15 AM      Result Value Ref Range   WBC 15.2 (*) 4.0 - 10.5 K/uL   RBC 4.31  3.87 - 5.11 MIL/uL   Hemoglobin 13.1  12.0 - 15.0 g/dL   HCT 38.4  36.0 - 46.0 %   MCV 89.1  78.0 - 100.0 fL   MCH 30.4  26.0 - 34.0 pg   MCHC 34.1  30.0 - 36.0 g/dL   RDW 13.5  11.5 - 15.5 %   Platelets 256  150 - 400 K/uL  HEPARIN LEVEL (UNFRACTIONATED)     Status: Abnormal   Collection Time    01/05/14  1:15 AM      Result Value Ref Range   Heparin Unfractionated 0.21 (*) 0.30 - 0.70 IU/mL   Comment:            IF HEPARIN RESULTS ARE BELOW     EXPECTED VALUES, AND PATIENT     DOSAGE HAS BEEN CONFIRMED,     SUGGEST FOLLOW UP TESTING     OF ANTITHROMBIN III LEVELS.  BASIC METABOLIC PANEL  Status: Abnormal   Collection Time    01/05/14  1:15 AM      Result Value Ref Range   Sodium 132 (*) 137 - 147 mEq/L   Potassium 3.9  3.7 - 5.3 mEq/L   Chloride 91 (*) 96 - 112 mEq/L   CO2 25  19 - 32 mEq/L   Glucose, Bld 183 (*) 70 - 99 mg/dL   BUN 17  6 - 23 mg/dL   Creatinine, Ser 1.26 (*) 0.50 - 1.10 mg/dL   Calcium 9.5  8.4 - 10.5 mg/dL   GFR calc non Af Amer 49 (*) >90 mL/min   GFR calc Af Amer 57 (*) >90 mL/min   Comment: (NOTE)     The eGFR has been calculated using the CKD EPI equation.     This calculation has not been validated in all clinical situations.     eGFR's persistently <90 mL/min signify possible Chronic Kidney     Disease.  GLUCOSE, CAPILLARY     Status: Abnormal   Collection Time    01/05/14  7:49 AM      Result Value Ref Range   Glucose-Capillary 199 (*) 70 - 99 mg/dL  HEPARIN LEVEL (UNFRACTIONATED)     Status: Abnormal   Collection Time    01/05/14 10:12 AM      Result Value Ref Range   Heparin Unfractionated 0.25 (*) 0.30 - 0.70 IU/mL   Comment:            IF HEPARIN RESULTS ARE BELOW     EXPECTED VALUES, AND PATIENT     DOSAGE HAS BEEN CONFIRMED,     SUGGEST FOLLOW UP  TESTING     OF ANTITHROMBIN III LEVELS.  GLUCOSE, CAPILLARY     Status: Abnormal   Collection Time    01/05/14 12:08 PM      Result Value Ref Range   Glucose-Capillary 255 (*) 70 - 99 mg/dL  GLUCOSE, CAPILLARY     Status: Abnormal   Collection Time    01/05/14  5:02 PM      Result Value Ref Range   Glucose-Capillary 189 (*) 70 - 99 mg/dL  GLUCOSE, CAPILLARY     Status: Abnormal   Collection Time    01/05/14  8:20 PM      Result Value Ref Range   Glucose-Capillary 208 (*) 70 - 99 mg/dL  CBC     Status: Abnormal   Collection Time    01/06/14  6:38 AM      Result Value Ref Range   WBC 13.5 (*) 4.0 - 10.5 K/uL   RBC 3.99  3.87 - 5.11 MIL/uL   Hemoglobin 11.9 (*) 12.0 - 15.0 g/dL   HCT 35.4 (*) 36.0 - 46.0 %   MCV 88.7  78.0 - 100.0 fL   MCH 29.8  26.0 - 34.0 pg   MCHC 33.6  30.0 - 36.0 g/dL   RDW 13.6  11.5 - 15.5 %   Platelets 269  150 - 400 K/uL  GLUCOSE, CAPILLARY     Status: Abnormal   Collection Time    01/06/14  7:51 AM      Result Value Ref Range   Glucose-Capillary 200 (*) 70 - 99 mg/dL   Comment 1 Documented in Chart     Comment 2 Notify RN      Imaging: Imaging results have been reviewed  Assessment/Plan:   1. Principal Problem: 2.   NSTEMI (non-ST elevated myocardial infarction) 3. Active Problems: 4.  Congestive dilated cardiomyopathy 5.   Diabetes mellitus 6.   Tobacco abuse 7.   Takotsubo syndrome 8.   Time Spent Directly with Patient:  20 minutes  Length of Stay:  LOS: 4 days   Looks great this AM and ready for D/C. Small apical occluded LAD. EF 20%. NSR. On approp meds. Will D/C home on LifeVest, repeat 2D in 3 months. ROV with Dr. Jearld Lesch.  Lorretta Harp 01/06/2014, 8:57 AM

## 2014-01-06 NOTE — Progress Notes (Signed)
CSW set up [facility] to pick up the patient and take patient home in [place] after life vest is fitted.  Patient will be picked up at the main entrance at 6:30. Clinical Social Worker will sign off for now as social work intervention is no longer needed. Please consult [place] again if new need arises.   Sabino Niemann, MSW, [employer] [phone]

## 2014-01-06 NOTE — Progress Notes (Signed)
CARDIAC REHAB PHASE I   PRE:  Rate/Rhythm: 75 SR    BP: sitting 100/60    SaO2: 93 RA  MODE:  Ambulation: 550 ft   POST:  Rate/Rhythm: 83 SR with PVC    BP: sitting 102/60     SaO2: 97 RA  Tolerated well. Denied SOB or CP. Only c/o is quadricep fatigue toward end of walk.  Discussed low sodium, stress management, ex with pt. Voiced understanding with good reception. [phone]  Megan Salon CES, ACSM 01/06/2014 11:25 AM

## 2014-01-06 NOTE — Progress Notes (Signed)
Give 10 pm dose of Brilinta prior to discharge per Theodore Demark, PA

## 2014-01-28 ENCOUNTER — Ambulatory Visit (INDEPENDENT_AMBULATORY_CARE_PROVIDER_SITE_OTHER): Payer: Medicare Other | Admitting: Physician Assistant

## 2014-01-28 ENCOUNTER — Encounter: Payer: Self-pay | Admitting: Physician Assistant

## 2014-01-28 VITALS — BP 104/60 | HR 77 | Ht 67.0 in | Wt 192.4 lb

## 2014-01-28 DIAGNOSIS — R9389 Abnormal findings on diagnostic imaging of other specified body structures: Secondary | ICD-10-CM

## 2014-01-28 DIAGNOSIS — I214 Non-ST elevation (NSTEMI) myocardial infarction: Secondary | ICD-10-CM

## 2014-01-28 DIAGNOSIS — Z720 Tobacco use: Secondary | ICD-10-CM

## 2014-01-28 DIAGNOSIS — F17200 Nicotine dependence, unspecified, uncomplicated: Secondary | ICD-10-CM

## 2014-01-28 DIAGNOSIS — R05 Cough: Secondary | ICD-10-CM

## 2014-01-28 DIAGNOSIS — I428 Other cardiomyopathies: Secondary | ICD-10-CM

## 2014-01-28 DIAGNOSIS — I252 Old myocardial infarction: Secondary | ICD-10-CM | POA: Diagnosis not present

## 2014-01-28 DIAGNOSIS — E785 Hyperlipidemia, unspecified: Secondary | ICD-10-CM

## 2014-01-28 DIAGNOSIS — R918 Other nonspecific abnormal finding of lung field: Secondary | ICD-10-CM

## 2014-01-28 DIAGNOSIS — R059 Cough, unspecified: Secondary | ICD-10-CM

## 2014-01-28 DIAGNOSIS — E119 Type 2 diabetes mellitus without complications: Secondary | ICD-10-CM

## 2014-01-28 DIAGNOSIS — I5022 Chronic systolic (congestive) heart failure: Secondary | ICD-10-CM | POA: Insufficient documentation

## 2014-01-28 DIAGNOSIS — I429 Cardiomyopathy, unspecified: Secondary | ICD-10-CM

## 2014-01-28 MED ORDER — FUROSEMIDE 40 MG PO TABS: 20.0000 mg | ORAL_TABLET | Freq: Every day | ORAL | Status: DC
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
## 2014-01-28 NOTE — Patient Instructions (Signed)
DECREASE LASIX TO 20 MG DAILY; IF YOUR WT GOES UP BY 3 LB X 1 DAY THEN OK TO INCREASE LASIX THAT DAY TO 40 MG  LAB WORK TODAY; BMET, CBC W/DIFF  CHEST XRAY AT [facility] DX COUGH, ABNORMAL CXR  Your physician recommends that you schedule a follow-up appointment in: 6 WEEKS WITH DR. Clifton James

## 2014-01-29 ENCOUNTER — Telehealth: Payer: Self-pay | Admitting: *Deleted

## 2014-01-29 LAB — BASIC METABOLIC PANEL
BUN: 8 mg/dL (ref 6–23)
CHLORIDE: 99 meq/L (ref 96–112)
CO2: 27 mEq/L (ref 19–32)
Calcium: 9.3 mg/dL (ref 8.4–10.5)
Creatinine, Ser: 1 mg/dL (ref 0.4–1.2)
GFR: 65.52 mL/min (ref >60.00)
GLUCOSE: 286 mg/dL — AB (ref 70–99)
POTASSIUM: 4.4 meq/L (ref 3.5–5.1)
SODIUM: 135 meq/L (ref 135–145)

## 2014-01-29 LAB — CBC WITH DIFFERENTIAL/PLATELET
Basophils Absolute: 0.1 10*3/uL (ref 0.0–0.1)
Basophils Relative: 0.6 % (ref 0.0–3.0)
Eosinophils Absolute: 0.3 10*3/uL (ref 0.0–0.7)
Eosinophils Relative: 2.2 % (ref 0.0–5.0)
HEMATOCRIT: 40.7 % (ref 36.0–46.0)
HEMOGLOBIN: 13.5 g/dL (ref 12.0–15.0)
Lymphocytes Relative: 34.4 % (ref 12.0–46.0)
Lymphs Abs: 4.1 10*3/uL — ABNORMAL HIGH (ref 0.7–4.0)
MCHC: 33.2 g/dL (ref 30.0–36.0)
MCV: 89.7 fl (ref 78.0–100.0)
MONOS PCT: 3.9 % (ref 3.0–12.0)
Monocytes Absolute: 0.5 10*3/uL (ref 0.1–1.0)
NEUTROS ABS: 7.1 10*3/uL (ref 1.4–7.7)
Neutrophils Relative %: 58.9 % (ref 43.0–77.0)
Platelets: 306 10*3/uL (ref 150.0–400.0)
RBC: 4.54 Mil/uL (ref 3.87–5.11)
RDW: 13.7 % (ref 11.5–15.5)
WBC: 12.1 10*3/uL — ABNORMAL HIGH (ref 4.0–10.5)

## 2014-01-29 NOTE — Telephone Encounter (Signed)
pt notified about lab results and that she needs to make sure to f/u w/PCP about DM II. Pt states she is trying to get a new PCP and having trouble due to medicare and medicaid. I advised to call DSS to see if they may be able to help. Also gave case manager, RN Tomi Bamberger who is on d/c , advised to try calling her to see if she may be able to help pt est with PCP. Pt said ok and thank you. Pt also said she is getting her CXR tomorrow that [employer]. PA ordered 01/28/14.

## 2014-01-30 ENCOUNTER — Encounter: Payer: Self-pay | Admitting: Physician Assistant

## 2014-01-30 DIAGNOSIS — R059 Cough, unspecified: Secondary | ICD-10-CM | POA: Diagnosis not present

## 2014-01-30 DIAGNOSIS — R05 Cough: Secondary | ICD-10-CM | POA: Diagnosis not present

## 2014-01-30 DIAGNOSIS — R918 Other nonspecific abnormal finding of lung field: Secondary | ICD-10-CM | POA: Diagnosis not present

## 2014-02-03 ENCOUNTER — Telehealth: Payer: Self-pay | Admitting: Physician Assistant

## 2014-02-03 NOTE — Telephone Encounter (Signed)
Discussed with Dr. Verne Carrow. We should arrange a follow up echo 6 weeks post MI. Please schedule echo to reassess EF the week of 7/6. Dx:  Cardiomyopathy. Tereso Newcomer, PA-C   02/03/2014 5:55 PM

## 2014-02-04 ENCOUNTER — Other Ambulatory Visit: Payer: Self-pay | Admitting: *Deleted

## 2014-02-04 DIAGNOSIS — I429 Cardiomyopathy, unspecified: Secondary | ICD-10-CM

## 2014-02-06 ENCOUNTER — Telehealth: Payer: Self-pay | Admitting: *Deleted

## 2014-02-06 NOTE — Telephone Encounter (Signed)
pt notified about per Bing Neighbors. PA normal cxr however; per Bing Neighbors. to f/u w/PCP about mildly elevated WBC, pt said she has a sinus infection right now.

## 2014-02-11 DIAGNOSIS — F319 Bipolar disorder, unspecified: Secondary | ICD-10-CM | POA: Diagnosis not present

## 2014-02-11 DIAGNOSIS — F17200 Nicotine dependence, unspecified, uncomplicated: Secondary | ICD-10-CM | POA: Diagnosis not present

## 2014-02-11 DIAGNOSIS — R05 Cough: Secondary | ICD-10-CM | POA: Diagnosis not present

## 2014-02-11 DIAGNOSIS — S90569A Insect bite (nonvenomous), unspecified ankle, initial encounter: Secondary | ICD-10-CM | POA: Diagnosis not present

## 2014-02-11 DIAGNOSIS — Z79899 Other long term (current) drug therapy: Secondary | ICD-10-CM | POA: Diagnosis not present

## 2014-02-11 DIAGNOSIS — J069 Acute upper respiratory infection, unspecified: Secondary | ICD-10-CM | POA: Diagnosis not present

## 2014-02-11 DIAGNOSIS — R059 Cough, unspecified: Secondary | ICD-10-CM | POA: Diagnosis not present

## 2014-02-11 DIAGNOSIS — E119 Type 2 diabetes mellitus without complications: Secondary | ICD-10-CM | POA: Diagnosis not present

## 2014-02-11 DIAGNOSIS — J45909 Unspecified asthma, uncomplicated: Secondary | ICD-10-CM | POA: Diagnosis not present

## 2014-02-24 ENCOUNTER — Ambulatory Visit (HOSPITAL_COMMUNITY): Payer: Medicare Other | Attending: Cardiology | Admitting: Radiology

## 2014-02-24 DIAGNOSIS — I428 Other cardiomyopathies: Secondary | ICD-10-CM | POA: Diagnosis not present

## 2014-02-24 DIAGNOSIS — E119 Type 2 diabetes mellitus without complications: Secondary | ICD-10-CM | POA: Diagnosis not present

## 2014-02-24 DIAGNOSIS — F17200 Nicotine dependence, unspecified, uncomplicated: Secondary | ICD-10-CM | POA: Diagnosis not present

## 2014-02-24 DIAGNOSIS — R011 Cardiac murmur, unspecified: Secondary | ICD-10-CM | POA: Diagnosis not present

## 2014-02-24 DIAGNOSIS — I5022 Chronic systolic (congestive) heart failure: Secondary | ICD-10-CM | POA: Insufficient documentation

## 2014-02-24 DIAGNOSIS — I429 Cardiomyopathy, unspecified: Secondary | ICD-10-CM

## 2014-02-24 NOTE — Progress Notes (Signed)
Echocardiogram performed.  

## 2014-02-26 ENCOUNTER — Encounter (HOSPITAL_COMMUNITY)
Admission: RE | Admit: 2014-02-26 | Discharge: 2014-02-26 | Disposition: A | Discharge status: Home / Self Care | Payer: Medicare Other | Source: Ambulatory Visit | Attending: Cardiovascular Disease | Admitting: Cardiovascular Disease

## 2014-02-26 VITALS — BP 102/80 | HR 67 | Ht 67.0 in | Wt 195.5 lb

## 2014-02-26 DIAGNOSIS — M2559 Pain in other specified joint: Secondary | ICD-10-CM | POA: Diagnosis not present

## 2014-02-26 DIAGNOSIS — I5181 Takotsubo syndrome: Secondary | ICD-10-CM

## 2014-02-26 DIAGNOSIS — M47817 Spondylosis without myelopathy or radiculopathy, lumbosacral region: Secondary | ICD-10-CM | POA: Diagnosis not present

## 2014-02-26 DIAGNOSIS — IMO0001 Reserved for inherently not codable concepts without codable children: Secondary | ICD-10-CM | POA: Diagnosis not present

## 2014-02-26 DIAGNOSIS — I219 Acute myocardial infarction, unspecified: Secondary | ICD-10-CM

## 2014-02-26 DIAGNOSIS — M47812 Spondylosis without myelopathy or radiculopathy, cervical region: Secondary | ICD-10-CM | POA: Diagnosis not present

## 2014-02-26 NOTE — Progress Notes (Signed)
Patient was referred to CR by Dr. Clifton James due to STEMI and Takotsubo. During orientation advised patient on arrival and appointment times what to wear, what to do before, during and after exercise. Reviewed attendance and class policy. Talked about inclement weather and class consultation policy. Pt is scheduled to start Cardiac Rehab on 03/09/14 at 11 am. Pt was advised to come to class 5 minutes before class starts. He was also given instructions on meeting with the dietician and attending the Family Structure classes. Pt is eager to get started. Patient was able to continue 6 minute walk test.

## 2014-02-26 NOTE — Patient Instructions (Signed)
Pt has finished orientation and is scheduled to start CR on 03/09/14 at 11 am. Pt has been instructed to arrive to class 15 minutes early for scheduled class. Pt has been instructed to wear comfortable clothing and shoes with rubber soles. Pt has been told to take their medications 1 hour prior to coming to class.  If the patient is not going to attend class, he/she has been instructed to call.

## 2014-03-02 ENCOUNTER — Telehealth: Payer: Self-pay | Admitting: Cardiovascular Disease

## 2014-03-02 NOTE — Telephone Encounter (Signed)
Contacted pt to inform her of her overall normal echo per Dr Clifton James.  Pt verbalized understanding and very pleased with this news.

## 2014-03-02 NOTE — Telephone Encounter (Signed)
Patient is calling to get results of echo. Please call and advise.

## 2014-03-06 NOTE — Progress Notes (Signed)
Quick Note:  Patient notified of ECHO results. Patient verbalized understanding and agreement with current treatment plan. ______

## 2014-03-09 ENCOUNTER — Encounter (HOSPITAL_COMMUNITY): Payer: Medicare Other

## 2014-03-11 ENCOUNTER — Ambulatory Visit: Payer: Self-pay | Admitting: Family Medicine

## 2014-03-11 ENCOUNTER — Encounter (HOSPITAL_COMMUNITY): Payer: Medicare Other

## 2014-03-13 ENCOUNTER — Encounter (HOSPITAL_COMMUNITY): Payer: Medicare Other

## 2014-03-16 ENCOUNTER — Encounter (HOSPITAL_COMMUNITY): Payer: Medicare Other

## 2014-03-18 ENCOUNTER — Encounter (HOSPITAL_COMMUNITY): Payer: Medicare Other

## 2014-03-20 ENCOUNTER — Encounter (HOSPITAL_COMMUNITY): Payer: Medicare Other

## 2014-03-23 ENCOUNTER — Ambulatory Visit (INDEPENDENT_AMBULATORY_CARE_PROVIDER_SITE_OTHER): Payer: Medicare Other | Admitting: Cardiovascular Disease

## 2014-03-23 ENCOUNTER — Other Ambulatory Visit: Payer: Medicare Other

## 2014-03-23 ENCOUNTER — Encounter: Payer: Self-pay | Admitting: Cardiovascular Disease

## 2014-03-23 ENCOUNTER — Encounter (HOSPITAL_COMMUNITY): Payer: Medicare Other

## 2014-03-23 VITALS — BP 122/80 | HR 88 | Ht 67.0 in | Wt 195.0 lb

## 2014-03-23 DIAGNOSIS — E7800 Pure hypercholesterolemia, unspecified: Secondary | ICD-10-CM | POA: Diagnosis not present

## 2014-03-23 DIAGNOSIS — I2510 Atherosclerotic heart disease of native coronary artery without angina pectoris: Secondary | ICD-10-CM

## 2014-03-23 DIAGNOSIS — I428 Other cardiomyopathies: Secondary | ICD-10-CM | POA: Diagnosis not present

## 2014-03-23 DIAGNOSIS — F17200 Nicotine dependence, unspecified, uncomplicated: Secondary | ICD-10-CM

## 2014-03-23 DIAGNOSIS — I429 Cardiomyopathy, unspecified: Secondary | ICD-10-CM

## 2014-03-23 DIAGNOSIS — Z720 Tobacco use: Secondary | ICD-10-CM
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
## 2014-03-23 NOTE — Patient Instructions (Signed)
Your physician wants you to follow-up in:  6 months.  You will receive a reminder letter in the mail two months in advance. If you don't receive a letter, please call our office to schedule the follow-up appointment.  Your physician recommends that you return for fasting lab work later this week. The lab opens at 7:30 AM every week day. --Lipid and liver profiles  You may take off Life Vest and mail it in.

## 2014-03-25 ENCOUNTER — Other Ambulatory Visit: Payer: Medicare Other

## 2014-03-25 ENCOUNTER — Encounter (HOSPITAL_COMMUNITY): Payer: Medicare Other

## 2014-03-27 ENCOUNTER — Encounter (HOSPITAL_COMMUNITY): Payer: Medicare Other

## 2014-03-30 ENCOUNTER — Encounter (HOSPITAL_COMMUNITY): Payer: Medicare Other

## 2014-03-31 ENCOUNTER — Ambulatory Visit: Payer: Medicare Other | Admitting: Family Medicine

## 2014-04-01 ENCOUNTER — Encounter (HOSPITAL_COMMUNITY): Payer: Medicare Other

## 2014-04-03 ENCOUNTER — Encounter (HOSPITAL_COMMUNITY): Payer: Medicare Other

## 2014-04-06 ENCOUNTER — Encounter (HOSPITAL_COMMUNITY): Payer: Medicare Other

## 2014-04-06 ENCOUNTER — Ambulatory Visit: Payer: Medicare Other | Admitting: Family Medicine

## 2014-04-08 ENCOUNTER — Encounter (HOSPITAL_COMMUNITY): Payer: Medicare Other

## 2014-04-10 ENCOUNTER — Encounter (HOSPITAL_COMMUNITY): Payer: Medicare Other

## 2014-04-13 ENCOUNTER — Encounter (HOSPITAL_COMMUNITY): Payer: Medicare Other

## 2014-04-15 ENCOUNTER — Encounter (HOSPITAL_COMMUNITY): Payer: Medicare Other

## 2014-04-17 ENCOUNTER — Encounter (HOSPITAL_COMMUNITY): Payer: Medicare Other

## 2014-04-20 ENCOUNTER — Encounter (HOSPITAL_COMMUNITY): Payer: Medicare Other

## 2014-04-21 ENCOUNTER — Ambulatory Visit (INDEPENDENT_AMBULATORY_CARE_PROVIDER_SITE_OTHER): Payer: Medicare Other | Admitting: Family Medicine

## 2014-04-21 ENCOUNTER — Encounter: Payer: Self-pay | Admitting: Family Medicine

## 2014-04-21 ENCOUNTER — Other Ambulatory Visit: Payer: Self-pay | Admitting: Physician Assistant

## 2014-04-21 VITALS — BP 118/66 | HR 76 | Temp 98.1°F | Resp 14 | Ht 67.0 in | Wt 189.0 lb

## 2014-04-21 DIAGNOSIS — E119 Type 2 diabetes mellitus without complications: Secondary | ICD-10-CM

## 2014-04-21 DIAGNOSIS — E785 Hyperlipidemia, unspecified: Secondary | ICD-10-CM

## 2014-04-21 DIAGNOSIS — F17200 Nicotine dependence, unspecified, uncomplicated: Secondary | ICD-10-CM | POA: Diagnosis not present

## 2014-04-21 DIAGNOSIS — I10 Essential (primary) hypertension: Secondary | ICD-10-CM

## 2014-04-21 DIAGNOSIS — I2510 Atherosclerotic heart disease of native coronary artery without angina pectoris: Secondary | ICD-10-CM | POA: Diagnosis not present

## 2014-04-21 DIAGNOSIS — Z720 Tobacco use: Secondary | ICD-10-CM

## 2014-04-21 DIAGNOSIS — F3131 Bipolar disorder, current episode depressed, mild: Secondary | ICD-10-CM

## 2014-04-21 LAB — CBC WITH DIFFERENTIAL/PLATELET
Basophils Absolute: 0.1 10*3/uL (ref 0.0–0.1)
Basophils Relative: 1 % (ref 0–1)
EOS PCT: 3 % (ref 0–5)
Eosinophils Absolute: 0.3 10*3/uL (ref 0.0–0.7)
HEMATOCRIT: 38.8 % (ref 36.0–46.0)
Hemoglobin: 13.1 g/dL (ref 12.0–15.0)
LYMPHS ABS: 3.9 10*3/uL (ref 0.7–4.0)
Lymphocytes Relative: 40 % (ref 12–46)
MCH: 29.8 pg (ref 26.0–34.0)
MCHC: 33.8 g/dL (ref 30.0–36.0)
MCV: 88.4 fL (ref 78.0–100.0)
Monocytes Absolute: 0.5 10*3/uL (ref 0.1–1.0)
Monocytes Relative: 5 % (ref 3–12)
Neutro Abs: 4.9 10*3/uL (ref 1.7–7.7)
Neutrophils Relative %: 51 % (ref 43–77)
Platelets: 329 10*3/uL (ref 150–400)
RBC: 4.39 MIL/uL (ref 3.87–5.11)
RDW: 13.3 % (ref 11.5–15.5)
WBC: 9.7 10*3/uL (ref 4.0–10.5)

## 2014-04-21 LAB — LIPID PANEL
Cholesterol: 177 mg/dL (ref 0–200)
HDL: 29 mg/dL — ABNORMAL LOW (ref >39)
LDL Cholesterol: 84 mg/dL (ref 0–99)
TRIGLYCERIDES: 322 mg/dL — AB (ref <150)
Total CHOL/HDL Ratio: 6.1 Ratio
VLDL: 64 mg/dL — AB (ref 0–40)

## 2014-04-21 LAB — COMPREHENSIVE METABOLIC PANEL
ALT: 16 U/L (ref 0–35)
AST: 15 U/L (ref 0–37)
Albumin: 3.9 g/dL (ref 3.5–5.2)
Alkaline Phosphatase: 112 U/L (ref 39–117)
BUN: 8 mg/dL (ref 6–23)
CO2: 24 meq/L (ref 19–32)
Calcium: 9.2 mg/dL (ref 8.4–10.5)
Chloride: 101 mEq/L (ref 96–112)
Creat: 0.92 mg/dL (ref 0.50–1.10)
Glucose, Bld: 292 mg/dL — ABNORMAL HIGH (ref 70–99)
Potassium: 4.7 mEq/L (ref 3.5–5.3)
Sodium: 134 mEq/L — ABNORMAL LOW (ref 135–145)
Total Bilirubin: 0.2 mg/dL (ref 0.2–1.2)
Total Protein: 6.9 g/dL (ref 6.0–8.3)

## 2014-04-21 LAB — TSH: TSH: 2.049 u[IU]/mL (ref 0.350–4.500)

## 2014-04-21 LAB — HEMOGLOBIN A1C
Hgb A1c MFr Bld: 7.8 % — ABNORMAL HIGH (ref <5.7)
Mean Plasma Glucose: 177 mg/dL — ABNORMAL HIGH (ref <117)

## 2014-04-21 MED ORDER — LAMOTRIGINE 25 MG PO TABS: 25.0000 mg | ORAL_TABLET | Freq: Every day | ORAL | Status: DC

## 2014-04-21 MED ORDER — METFORMIN HCL 500 MG PO TABS: 500.0000 mg | ORAL_TABLET | Freq: Two times a day (BID) | ORAL | Status: DC

## 2014-04-21 MED ORDER — SERTRALINE HCL 50 MG PO TABS: 50.0000 mg | ORAL_TABLET | Freq: Every day | ORAL | Status: DC

## 2014-04-21 NOTE — Patient Instructions (Signed)
Restart zoloft 50mg  once a day  Restart lamictal 25mg  once a day  Referral to psychiatry in [place] Take glucose once a day fasting and record Restart metformin 500mg  twice a day  We will call with lab results  F/U 8 weeks for PHYSICAL

## 2014-04-22 ENCOUNTER — Encounter (HOSPITAL_COMMUNITY): Payer: Medicare Other

## 2014-04-22 NOTE — Assessment & Plan Note (Signed)
I will restart her on Zoloft 50 mg once a day she will also be restarted on the Meckel's 25 mg she will have to be titrated upward her for psychiatry to do this we will get an urgent referral

## 2014-04-22 NOTE — Assessment & Plan Note (Signed)
Recheck lipid panel her goal is LDL less than 100 special with recent myocardial infarction

## 2014-04-22 NOTE — Assessment & Plan Note (Addendum)
diabetes mellitus is uncontrolled she will restart metformin 500 mg twice a day we discussed importance of her stay on her cardiovascular and diabetic medications, as she is able to purchase her pain medications are regular basis Diabetic shoes were also ordered Script for meter given

## 2014-04-22 NOTE — Progress Notes (Signed)
Patient ID: Erica Herrera, female   DOB: 1965/10/27, 48 y.o.   MRN: [id number]   Subjective:    Patient ID: Erica Herrera, female    DOB: October 16, 1965, 48 y.o.   MRN: [id number]  Patient presents for DM F/U and Frequent Migraines  patient here to follow chronic medical problems. She's not been seen since April 2014. Since her last visit she had not received didn't she had possible ST elevation MI from an apical thrombus. She was seen by cardiology. She's not had any of her medication with the exception of her pain medicine in the past 4 weeks. She still in pain clinic with Dr. Laurian Brim. She never established with a new psychiatrist and has been off of her bipolar medications for the past 9 months. She was on Lamictal as well as Zoloft.  Also has diabetes mellitus her last A1c which was done in the hospital was 8.6% and she's not had any treatment for this as well.  She has multiple concerns that she tried to lift off today including headaches physical exam labs and other ailments that she wanted to discuss we will discuss these at future visits  Review Of Systems:  GEN- +fatigue, fever, weight loss,weakness, recent illness HEENT- denies eye drainage, change in vision, nasal discharge, CVS- denies chest pain, palpitations RESP- denies SOB, cough, wheeze ABD- denies N/V, change in stools, abd pain GU- denies dysuria, hematuria, dribbling, incontinence MSK- + joint pain, muscle aches, injury Neuro- denies headache, dizziness, syncope, seizure activity       Objective:    BP 118/66  Pulse 76  Temp(Src) 98.1 F (36.7 C) (Oral)  Resp 14  Ht 5\' 7"  (1.702 m)  Wt 189 lb (85.73 kg)  BMI 29.59 kg/m2  LMP 04/06/2014 GEN- NAD, alert and oriented x3 HEENT- PERRL, EOMI, non injected sclera, pink conjunctiva, MMM, oropharynx clear Neck- Supple, no thyromegaly CVS- RRR, no murmur RESP-CTAB ABD-NABS,soft,NT,ND PSYCH- normal affect and mood EXT- No edema Pulses- Radial, DP- 2+         Assessment & Plan:      Problem List Items Addressed This Visit   Hyperlipidemia   Relevant Orders      Lipid panel (Completed)   Essential hypertension, benign   Relevant Orders      TSH (Completed)   DM (diabetes mellitus) - Primary   Relevant Medications      metFORMIN (GLUCOPHAGE) tablet   Other Relevant Orders      Comprehensive metabolic panel (Completed)      CBC with Differential (Completed)      Hemoglobin A1c (Completed)      Note: This dictation was prepared with Dragon dictation along with smaller phrase technology. Any transcriptional errors that result from this process are unintentional.

## 2014-04-22 NOTE — Assessment & Plan Note (Signed)
Pressure looks okay she will restart her medications

## 2014-04-24 ENCOUNTER — Encounter (HOSPITAL_COMMUNITY): Payer: Medicare Other

## 2014-04-27 ENCOUNTER — Encounter (HOSPITAL_COMMUNITY): Payer: Medicare Other

## 2014-04-29 ENCOUNTER — Encounter (HOSPITAL_COMMUNITY): Payer: Medicare Other

## 2014-05-01 ENCOUNTER — Encounter (HOSPITAL_COMMUNITY): Payer: Medicare Other

## 2014-05-04 ENCOUNTER — Encounter (HOSPITAL_COMMUNITY): Payer: Medicare Other

## 2014-05-06 ENCOUNTER — Encounter (HOSPITAL_COMMUNITY): Payer: Medicare Other

## 2014-05-08 ENCOUNTER — Encounter (HOSPITAL_COMMUNITY): Payer: Medicare Other

## 2014-05-11 ENCOUNTER — Encounter (HOSPITAL_COMMUNITY): Payer: Medicare Other

## 2014-05-13 ENCOUNTER — Encounter (HOSPITAL_COMMUNITY): Payer: Medicare Other

## 2014-05-15 ENCOUNTER — Encounter (HOSPITAL_COMMUNITY): Payer: Medicare Other

## 2014-05-18 ENCOUNTER — Encounter (HOSPITAL_COMMUNITY): Payer: Medicare Other

## 2014-05-20 ENCOUNTER — Encounter (HOSPITAL_COMMUNITY): Payer: Medicare Other

## 2014-05-20 DIAGNOSIS — R6889 Other general symptoms and signs: Secondary | ICD-10-CM | POA: Diagnosis not present

## 2014-05-20 DIAGNOSIS — M549 Dorsalgia, unspecified: Secondary | ICD-10-CM | POA: Diagnosis not present

## 2014-05-21 DIAGNOSIS — J45909 Unspecified asthma, uncomplicated: Secondary | ICD-10-CM | POA: Diagnosis not present

## 2014-05-21 DIAGNOSIS — M549 Dorsalgia, unspecified: Secondary | ICD-10-CM | POA: Diagnosis not present

## 2014-05-21 DIAGNOSIS — E119 Type 2 diabetes mellitus without complications: Secondary | ICD-10-CM | POA: Diagnosis not present

## 2014-05-21 DIAGNOSIS — Y92019 Unspecified place in single-family (private) house as the place of occurrence of the external cause: Secondary | ICD-10-CM | POA: Diagnosis not present

## 2014-05-21 DIAGNOSIS — F17200 Nicotine dependence, unspecified, uncomplicated: Secondary | ICD-10-CM | POA: Diagnosis not present

## 2014-05-21 DIAGNOSIS — S3981XA Other specified injuries of abdomen, initial encounter: Secondary | ICD-10-CM | POA: Diagnosis not present

## 2014-05-21 DIAGNOSIS — Z79899 Other long term (current) drug therapy: Secondary | ICD-10-CM | POA: Diagnosis not present

## 2014-05-21 DIAGNOSIS — S42291A Other displaced fracture of upper end of right humerus, initial encounter for closed fracture: Secondary | ICD-10-CM | POA: Diagnosis not present

## 2014-05-21 DIAGNOSIS — Z7982 Long term (current) use of aspirin: Secondary | ICD-10-CM | POA: Diagnosis not present

## 2014-05-21 DIAGNOSIS — S4291XA Fracture of right shoulder girdle, part unspecified, initial encounter for closed fracture: Secondary | ICD-10-CM | POA: Diagnosis not present

## 2014-05-21 DIAGNOSIS — R109 Unspecified abdominal pain: Secondary | ICD-10-CM | POA: Diagnosis not present

## 2014-05-21 DIAGNOSIS — I252 Old myocardial infarction: Secondary | ICD-10-CM | POA: Diagnosis not present

## 2014-05-21 DIAGNOSIS — S298XXA Other specified injuries of thorax, initial encounter: Secondary | ICD-10-CM | POA: Diagnosis not present

## 2014-05-22 ENCOUNTER — Encounter (HOSPITAL_COMMUNITY): Payer: Medicare Other

## 2014-05-25 ENCOUNTER — Encounter (HOSPITAL_COMMUNITY): Payer: Medicare Other

## 2014-05-25 ENCOUNTER — Encounter: Payer: Self-pay | Admitting: Family Medicine

## 2014-05-25 DIAGNOSIS — S42301D Unspecified fracture of shaft of humerus, right arm, subsequent encounter for fracture with routine healing: Secondary | ICD-10-CM

## 2014-05-27 ENCOUNTER — Encounter (HOSPITAL_COMMUNITY): Payer: Medicare Other

## 2014-05-29 ENCOUNTER — Encounter (HOSPITAL_COMMUNITY): Payer: Medicare Other

## 2014-06-04 DIAGNOSIS — S40911A Unspecified superficial injury of right shoulder, initial encounter: Secondary | ICD-10-CM | POA: Diagnosis not present

## 2014-06-04 DIAGNOSIS — M25511 Pain in right shoulder: Secondary | ICD-10-CM | POA: Diagnosis not present

## 2014-06-04 DIAGNOSIS — S4991XA Unspecified injury of right shoulder and upper arm, initial encounter: Secondary | ICD-10-CM | POA: Diagnosis not present

## 2014-06-16 ENCOUNTER — Encounter: Payer: Medicare Other | Admitting: Family Medicine

## 2014-06-23 ENCOUNTER — Encounter: Payer: Medicare Other | Admitting: Family Medicine

## 2014-07-06 ENCOUNTER — Encounter: Payer: Self-pay | Admitting: Family Medicine

## 2014-07-13 ENCOUNTER — Encounter: Payer: Medicare Other | Admitting: Family Medicine

## 2014-07-30 ENCOUNTER — Encounter (HOSPITAL_COMMUNITY): Payer: Self-pay | Admitting: Cardiovascular Disease

## 2014-08-19 DIAGNOSIS — M5030 Other cervical disc degeneration, unspecified cervical region: Secondary | ICD-10-CM | POA: Diagnosis not present

## 2014-08-19 DIAGNOSIS — M47816 Spondylosis without myelopathy or radiculopathy, lumbar region: Secondary | ICD-10-CM | POA: Diagnosis not present

## 2014-08-19 DIAGNOSIS — M545 Low back pain: Secondary | ICD-10-CM | POA: Diagnosis not present

## 2014-08-19 DIAGNOSIS — M47812 Spondylosis without myelopathy or radiculopathy, cervical region: Secondary | ICD-10-CM | POA: Diagnosis not present

## 2014-08-24 ENCOUNTER — Encounter: Payer: Medicare Other | Admitting: Family Medicine

## 2014-08-26 ENCOUNTER — Encounter: Payer: Medicare Other | Admitting: Family Medicine

## 2014-08-28 ENCOUNTER — Telehealth: Payer: Self-pay | Admitting: Family Medicine

## 2014-08-28 ENCOUNTER — Encounter: Payer: Self-pay | Admitting: Family Medicine

## 2014-08-28 NOTE — Telephone Encounter (Signed)
-----   Message from Salley Scarlet, MD sent at 08/26/2014  5:47 PM EST ----- Regarding: Letter   Can you send pt some type of letter stating she has Late cancelled or No showed multiple times. If she has one more incident I will dismiss her.   Thanks

## 2014-08-28 NOTE — Telephone Encounter (Signed)
I have sent patient a letter stating if she has another late cancellation or no show she will be dismissed from the practice per the request of Dr. Jeanice Lim. A copy of the letter is in patients chart.

## 2014-09-01 DIAGNOSIS — Z882 Allergy status to sulfonamides status: Secondary | ICD-10-CM | POA: Diagnosis not present

## 2014-09-01 DIAGNOSIS — M47812 Spondylosis without myelopathy or radiculopathy, cervical region: Secondary | ICD-10-CM | POA: Diagnosis not present

## 2014-09-01 DIAGNOSIS — M5408 Panniculitis affecting regions of neck and back, sacral and sacrococcygeal region: Secondary | ICD-10-CM | POA: Diagnosis not present

## 2014-09-01 DIAGNOSIS — Z888 Allergy status to other drugs, medicaments and biological substances status: Secondary | ICD-10-CM | POA: Diagnosis not present

## 2014-09-01 DIAGNOSIS — E119 Type 2 diabetes mellitus without complications: Secondary | ICD-10-CM | POA: Diagnosis not present

## 2014-09-01 DIAGNOSIS — Z79899 Other long term (current) drug therapy: Secondary | ICD-10-CM | POA: Diagnosis not present

## 2014-09-01 DIAGNOSIS — M5030 Other cervical disc degeneration, unspecified cervical region: Secondary | ICD-10-CM | POA: Diagnosis not present

## 2014-09-01 DIAGNOSIS — M47816 Spondylosis without myelopathy or radiculopathy, lumbar region: Secondary | ICD-10-CM | POA: Diagnosis not present

## 2014-09-01 DIAGNOSIS — F319 Bipolar disorder, unspecified: Secondary | ICD-10-CM | POA: Diagnosis not present

## 2014-09-01 DIAGNOSIS — F17210 Nicotine dependence, cigarettes, uncomplicated: Secondary | ICD-10-CM | POA: Diagnosis not present

## 2014-09-01 DIAGNOSIS — M791 Myalgia: Secondary | ICD-10-CM | POA: Diagnosis not present

## 2014-09-01 DIAGNOSIS — G8929 Other chronic pain: Secondary | ICD-10-CM | POA: Diagnosis not present

## 2014-09-01 DIAGNOSIS — Z880 Allergy status to penicillin: Secondary | ICD-10-CM | POA: Diagnosis not present

## 2014-09-01 DIAGNOSIS — M5136 Other intervertebral disc degeneration, lumbar region: Secondary | ICD-10-CM | POA: Diagnosis not present

## 2014-09-01 DIAGNOSIS — M961 Postlaminectomy syndrome, not elsewhere classified: Secondary | ICD-10-CM | POA: Diagnosis not present

## 2014-09-01 DIAGNOSIS — Z981 Arthrodesis status: Secondary | ICD-10-CM | POA: Diagnosis not present

## 2014-09-04 DIAGNOSIS — F319 Bipolar disorder, unspecified: Secondary | ICD-10-CM | POA: Diagnosis not present

## 2014-09-25 DIAGNOSIS — R51 Headache: Secondary | ICD-10-CM | POA: Diagnosis not present

## 2014-09-25 DIAGNOSIS — Y939 Activity, unspecified: Secondary | ICD-10-CM | POA: Diagnosis not present

## 2014-09-25 DIAGNOSIS — M542 Cervicalgia: Secondary | ICD-10-CM | POA: Diagnosis not present

## 2014-09-25 DIAGNOSIS — E119 Type 2 diabetes mellitus without complications: Secondary | ICD-10-CM | POA: Diagnosis not present

## 2014-09-25 DIAGNOSIS — M159 Polyosteoarthritis, unspecified: Secondary | ICD-10-CM | POA: Diagnosis not present

## 2014-09-25 DIAGNOSIS — S199XXA Unspecified injury of neck, initial encounter: Secondary | ICD-10-CM | POA: Diagnosis not present

## 2014-09-25 DIAGNOSIS — S0990XA Unspecified injury of head, initial encounter: Secondary | ICD-10-CM | POA: Diagnosis not present

## 2014-09-25 DIAGNOSIS — S59901A Unspecified injury of right elbow, initial encounter: Secondary | ICD-10-CM | POA: Diagnosis not present

## 2014-09-25 DIAGNOSIS — S53401A Unspecified sprain of right elbow, initial encounter: Secondary | ICD-10-CM | POA: Diagnosis not present

## 2014-09-25 DIAGNOSIS — Z79899 Other long term (current) drug therapy: Secondary | ICD-10-CM | POA: Diagnosis not present

## 2014-09-25 DIAGNOSIS — F17200 Nicotine dependence, unspecified, uncomplicated: Secondary | ICD-10-CM | POA: Diagnosis not present

## 2014-10-08 ENCOUNTER — Encounter: Payer: Self-pay | Admitting: Cardiovascular Disease

## 2014-10-08 ENCOUNTER — Ambulatory Visit (INDEPENDENT_AMBULATORY_CARE_PROVIDER_SITE_OTHER): Payer: Medicare Other | Admitting: Cardiovascular Disease

## 2014-10-08 VITALS — BP 120/82 | HR 76 | Ht 67.0 in | Wt 182.0 lb

## 2014-10-08 DIAGNOSIS — E785 Hyperlipidemia, unspecified: Secondary | ICD-10-CM | POA: Diagnosis not present

## 2014-10-08 DIAGNOSIS — I429 Cardiomyopathy, unspecified: Secondary | ICD-10-CM

## 2014-10-08 DIAGNOSIS — I428 Other cardiomyopathies: Secondary | ICD-10-CM

## 2014-10-08 DIAGNOSIS — I2510 Atherosclerotic heart disease of native coronary artery without angina pectoris: Secondary | ICD-10-CM | POA: Diagnosis not present

## 2014-10-08 DIAGNOSIS — Z720 Tobacco use: Secondary | ICD-10-CM | POA: Diagnosis not present
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
## 2014-10-08 NOTE — Patient Instructions (Signed)
Your physician wants you to follow-up in:  12 months.  You will receive a reminder letter in the mail two months in advance. If you don't receive a letter, please call our office to schedule the follow-up appointment.   

## 2014-10-26 DIAGNOSIS — F17210 Nicotine dependence, cigarettes, uncomplicated: Secondary | ICD-10-CM | POA: Diagnosis not present

## 2014-10-26 DIAGNOSIS — F319 Bipolar disorder, unspecified: Secondary | ICD-10-CM | POA: Diagnosis not present

## 2014-10-26 DIAGNOSIS — G8929 Other chronic pain: Secondary | ICD-10-CM | POA: Diagnosis not present

## 2014-10-26 DIAGNOSIS — Z888 Allergy status to other drugs, medicaments and biological substances status: Secondary | ICD-10-CM | POA: Diagnosis not present

## 2014-10-26 DIAGNOSIS — M47816 Spondylosis without myelopathy or radiculopathy, lumbar region: Secondary | ICD-10-CM | POA: Diagnosis not present

## 2014-10-26 DIAGNOSIS — Z882 Allergy status to sulfonamides status: Secondary | ICD-10-CM | POA: Diagnosis not present

## 2014-10-26 DIAGNOSIS — Z981 Arthrodesis status: Secondary | ICD-10-CM | POA: Diagnosis not present

## 2014-10-26 DIAGNOSIS — Z79899 Other long term (current) drug therapy: Secondary | ICD-10-CM | POA: Diagnosis not present

## 2014-10-26 DIAGNOSIS — Z880 Allergy status to penicillin: Secondary | ICD-10-CM | POA: Diagnosis not present

## 2014-10-26 DIAGNOSIS — E119 Type 2 diabetes mellitus without complications: Secondary | ICD-10-CM | POA: Diagnosis not present

## 2014-12-01 DIAGNOSIS — M5136 Other intervertebral disc degeneration, lumbar region: Secondary | ICD-10-CM | POA: Diagnosis not present

## 2014-12-01 DIAGNOSIS — M5030 Other cervical disc degeneration, unspecified cervical region: Secondary | ICD-10-CM | POA: Diagnosis not present

## 2014-12-01 DIAGNOSIS — M961 Postlaminectomy syndrome, not elsewhere classified: Secondary | ICD-10-CM | POA: Diagnosis not present

## 2014-12-01 DIAGNOSIS — M47812 Spondylosis without myelopathy or radiculopathy, cervical region: Secondary | ICD-10-CM | POA: Diagnosis not present

## 2014-12-28 DIAGNOSIS — Z79899 Other long term (current) drug therapy: Secondary | ICD-10-CM | POA: Diagnosis not present

## 2014-12-28 DIAGNOSIS — I252 Old myocardial infarction: Secondary | ICD-10-CM | POA: Diagnosis not present

## 2014-12-28 DIAGNOSIS — E119 Type 2 diabetes mellitus without complications: Secondary | ICD-10-CM | POA: Diagnosis not present

## 2014-12-28 DIAGNOSIS — M19012 Primary osteoarthritis, left shoulder: Secondary | ICD-10-CM | POA: Diagnosis not present

## 2014-12-28 DIAGNOSIS — R05 Cough: Secondary | ICD-10-CM | POA: Diagnosis not present

## 2014-12-28 DIAGNOSIS — S29012A Strain of muscle and tendon of back wall of thorax, initial encounter: Secondary | ICD-10-CM | POA: Diagnosis not present

## 2014-12-28 DIAGNOSIS — F17200 Nicotine dependence, unspecified, uncomplicated: Secondary | ICD-10-CM | POA: Diagnosis not present

## 2014-12-28 DIAGNOSIS — X58XXXA Exposure to other specified factors, initial encounter: Secondary | ICD-10-CM | POA: Diagnosis not present

## 2014-12-28 DIAGNOSIS — J45901 Unspecified asthma with (acute) exacerbation: Secondary | ICD-10-CM | POA: Diagnosis not present

## 2014-12-28 DIAGNOSIS — Z79891 Long term (current) use of opiate analgesic: Secondary | ICD-10-CM | POA: Diagnosis not present

## 2015-01-04 DIAGNOSIS — F319 Bipolar disorder, unspecified: Secondary | ICD-10-CM | POA: Diagnosis not present

## 2015-01-13 DIAGNOSIS — M47812 Spondylosis without myelopathy or radiculopathy, cervical region: Secondary | ICD-10-CM | POA: Diagnosis not present

## 2015-01-13 DIAGNOSIS — M5136 Other intervertebral disc degeneration, lumbar region: Secondary | ICD-10-CM | POA: Diagnosis not present

## 2015-01-13 DIAGNOSIS — M961 Postlaminectomy syndrome, not elsewhere classified: Secondary | ICD-10-CM | POA: Diagnosis not present

## 2015-01-13 DIAGNOSIS — M5030 Other cervical disc degeneration, unspecified cervical region: Secondary | ICD-10-CM | POA: Diagnosis not present

## 2015-01-20 DIAGNOSIS — M47812 Spondylosis without myelopathy or radiculopathy, cervical region: Secondary | ICD-10-CM | POA: Diagnosis not present

## 2015-01-20 DIAGNOSIS — M2578 Osteophyte, vertebrae: Secondary | ICD-10-CM | POA: Diagnosis not present

## 2015-01-26 ENCOUNTER — Other Ambulatory Visit: Payer: Self-pay | Admitting: Physician Assistant

## 2015-01-27 ENCOUNTER — Encounter: Payer: Self-pay | Admitting: Family Medicine

## 2015-01-27 ENCOUNTER — Ambulatory Visit (INDEPENDENT_AMBULATORY_CARE_PROVIDER_SITE_OTHER): Payer: Medicare Other | Admitting: Family Medicine

## 2015-01-27 VITALS — BP 128/78 | HR 72 | Temp 97.7°F | Resp 16 | Ht 67.0 in | Wt 181.0 lb

## 2015-01-27 DIAGNOSIS — M549 Dorsalgia, unspecified: Secondary | ICD-10-CM | POA: Diagnosis not present

## 2015-01-27 DIAGNOSIS — M542 Cervicalgia: Secondary | ICD-10-CM

## 2015-01-27 DIAGNOSIS — G8929 Other chronic pain: Secondary | ICD-10-CM | POA: Diagnosis not present

## 2015-01-27 DIAGNOSIS — E119 Type 2 diabetes mellitus without complications: Secondary | ICD-10-CM

## 2015-01-27 DIAGNOSIS — I2510 Atherosclerotic heart disease of native coronary artery without angina pectoris: Secondary | ICD-10-CM | POA: Diagnosis not present

## 2015-01-27 DIAGNOSIS — E785 Hyperlipidemia, unspecified: Secondary | ICD-10-CM

## 2015-01-27 DIAGNOSIS — I10 Essential (primary) hypertension: Secondary | ICD-10-CM

## 2015-01-27 LAB — CBC WITH DIFFERENTIAL/PLATELET
BASOS PCT: 1 % (ref 0–1)
Basophils Absolute: 0.1 10*3/uL (ref 0.0–0.1)
EOS ABS: 0.4 10*3/uL (ref 0.0–0.7)
Eosinophils Relative: 4 % (ref 0–5)
HCT: 45.5 % (ref 36.0–46.0)
HEMOGLOBIN: 15 g/dL (ref 12.0–15.0)
Lymphocytes Relative: 46 % (ref 12–46)
Lymphs Abs: 4.5 10*3/uL — ABNORMAL HIGH (ref 0.7–4.0)
MCH: 29.8 pg (ref 26.0–34.0)
MCHC: 33 g/dL (ref 30.0–36.0)
MCV: 90.3 fL (ref 78.0–100.0)
MONO ABS: 0.5 10*3/uL (ref 0.1–1.0)
MPV: 11.1 fL (ref 8.6–12.4)
Monocytes Relative: 5 % (ref 3–12)
NEUTROS PCT: 44 % (ref 43–77)
Neutro Abs: 4.3 10*3/uL (ref 1.7–7.7)
PLATELETS: 315 10*3/uL (ref 150–400)
RBC: 5.04 MIL/uL (ref 3.87–5.11)
RDW: 13.8 % (ref 11.5–15.5)
WBC: 9.8 10*3/uL (ref 4.0–10.5)

## 2015-01-27 LAB — LIPID PANEL
Cholesterol: 163 mg/dL (ref 0–200)
HDL: 24 mg/dL — ABNORMAL LOW (ref >=46)
LDL Cholesterol: 91 mg/dL (ref 0–99)
Total CHOL/HDL Ratio: 6.8 Ratio
Triglycerides: 239 mg/dL — ABNORMAL HIGH (ref <150)
VLDL: 48 mg/dL — AB (ref 0–40)

## 2015-01-27 LAB — COMPREHENSIVE METABOLIC PANEL
ALK PHOS: 109 U/L (ref 39–117)
ALT: 14 U/L (ref 0–35)
AST: 18 U/L (ref 0–37)
Albumin: 3.8 g/dL (ref 3.5–5.2)
BILIRUBIN TOTAL: 0.3 mg/dL (ref 0.2–1.2)
BUN: 5 mg/dL — AB (ref 6–23)
CALCIUM: 9.3 mg/dL (ref 8.4–10.5)
CHLORIDE: 103 meq/L (ref 96–112)
CO2: 26 mEq/L (ref 19–32)
Creat: 0.85 mg/dL (ref 0.50–1.10)
Glucose, Bld: 253 mg/dL — ABNORMAL HIGH (ref 70–99)
POTASSIUM: 5.1 meq/L (ref 3.5–5.3)
SODIUM: 137 meq/L (ref 135–145)
TOTAL PROTEIN: 6.9 g/dL (ref 6.0–8.3)

## 2015-01-27 LAB — HEMOGLOBIN A1C
HEMOGLOBIN A1C: 11.3 % — AB (ref <5.7)
MEAN PLASMA GLUCOSE: 278 mg/dL — AB (ref <117)

## 2015-01-27 MED ORDER — LAMOTRIGINE 25 MG PO TABS: 25.0000 mg | ORAL_TABLET | Freq: Two times a day (BID) | ORAL | Status: DC

## 2015-01-27 MED ORDER — GABAPENTIN 600 MG PO TABS: 600.0000 mg | ORAL_TABLET | Freq: Three times a day (TID) | ORAL | Status: DC

## 2015-01-27 NOTE — Assessment & Plan Note (Signed)
Defer to pain clinic, I will write a letter indicating need for withdrawel from school at this time due to current circumstances and medications

## 2015-01-27 NOTE — Assessment & Plan Note (Signed)
Check lipid panel, LDL around 70 with CAD and DM is goal

## 2015-01-27 NOTE — Patient Instructions (Signed)
Take your medications prescribed  I will provide a letter for your university F/U 3 months for PHYSICAL

## 2015-01-27 NOTE — Progress Notes (Signed)
Patient ID: Erica Herrera, female   DOB: 07-Oct-1965, 49 y.o.   MRN: [id number]   Subjective:    Patient ID: Erica Herrera, female    DOB: 1965-11-01, 49 y.o.   MRN: [id number]  Patient presents for Back Injury  patient actually presented because she needed a note for her [name] stating that she had to withdraw from school. She has history of chronic neck and back pain. She is actually having worsening shoulder and neck pain she had MRI done which has not been read this was ordered by her pain medicine doctor Dr. Laurian Brim. She does have some bulging disc in her spine. 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 She is in pain management per above and is on morphine sulfate as well as dialogue.  History of myocardial infarction she did see cardiology back in the winter but she has not followed up with me since September 2015 for her diabetes hypertension cholesterol. She states that she forgets to take her diabetes medication although she is not forgetting to take her pain medication. She states [facility] her Lamictal and Zoloft but she is not in therapy.  [facility] [address]  Review Of Systems:  GEN- denies fatigue, fever, weight loss,weakness, recent illness HEENT- denies eye drainage, change in vision, nasal discharge, CVS- denies chest pain, palpitations RESP- denies SOB, cough, wheeze ABD- denies N/V, change in stools, abd pain GU- denies dysuria, hematuria, dribbling, incontinence MSK- +joint pain, muscle aches, injury Neuro- denies headache, dizziness, syncope, seizure activity       Objective:    BP 128/78 mmHg  Pulse 72  Temp(Src) 97.7 F (36.5 C) (Oral)  Resp 16  Ht 5\' 7"  (1.702 m)  Wt 181 lb (82.101 kg)  BMI 28.34 kg/m2  LMP 12/18/2014  (Approximate) GEN- NAD, alert and oriented x3,strong odor HEENT- PERRL, EOMI, non injected sclera, pink conjunctiva, MMM, oropharynx clear Neck- Supple, Decreased ROM, holding head to the right  CVS- RRR, no murmur RESP-CTAB EXT- No edema Pulses- Radial  2+        Assessment & Plan:      Problem List Items Addressed This Visit    Hyperlipidemia    Check lipid panel, LDL around 70 with CAD and DM is goal      Relevant Orders   Lipid panel (Completed)   Essential hypertension, benign - Primary    Well controlled,       Relevant Orders   CBC with Differential/Platelet (Completed)   Comprehensive metabolic panel (Completed)   DM (diabetes mellitus)    Check A1C, discussed compliance, which honestly her excuse makes no sense as she is taking her pain meds and other meds without any difficulty      Relevant Orders   Hemoglobin A1c (Completed)   Microalbumin / creatinine urine ratio   Chronic neck pain    Defer to pain clinic, I will write a letter indicating need for withdrawel from school at this time due to current circumstances and medications      Relevant Medications   morphine (MSIR) 15 MG tablet   lamoTRIgine (LAMICTAL) 25 MG tablet   gabapentin (NEURONTIN) 600 MG tablet   Chronic back pain   Relevant Medications   morphine (MSIR) 15  MG tablet      Note: This dictation was prepared with Dragon dictation along with smaller phrase technology. Any transcriptional errors that result from this process are unintentional.

## 2015-01-27 NOTE — Assessment & Plan Note (Signed)
Check A1C, discussed compliance, which honestly her excuse makes no sense as she is taking her pain meds and other meds without any difficulty

## 2015-01-27 NOTE — Assessment & Plan Note (Signed)
Well controlled,  

## 2015-01-27 NOTE — Telephone Encounter (Signed)
Per note 2.18.16 

## 2015-01-28 ENCOUNTER — Encounter: Payer: Self-pay | Admitting: Family Medicine

## 2015-01-28 LAB — MICROALBUMIN / CREATININE URINE RATIO
Creatinine, Urine: 83.6 mg/dL
Microalb Creat Ratio: 3.6 mg/g (ref 0.0–30.0)
Microalb, Ur: 0.3 mg/dL (ref <2.0)

## 2015-01-29 ENCOUNTER — Other Ambulatory Visit: Payer: Self-pay | Admitting: *Deleted

## 2015-01-29 ENCOUNTER — Encounter: Payer: Self-pay | Admitting: *Deleted

## 2015-01-29 ENCOUNTER — Other Ambulatory Visit: Payer: Self-pay | Admitting: Physician Assistant

## 2015-01-29 MED ORDER — GLUCOSE BLOOD VI STRP
ORAL_STRIP | Status: DC
Start: 2015-01-29 — End: 2018-03-15

## 2015-01-29 MED ORDER — ALBUTEROL SULFATE HFA 108 (90 BASE) MCG/ACT IN AERS: 2.0000 | INHALATION_SPRAY | Freq: Four times a day (QID) | RESPIRATORY_TRACT | Status: DC | PRN

## 2015-01-29 MED ORDER — METFORMIN HCL 1000 MG PO TABS: 1000.0000 mg | ORAL_TABLET | Freq: Two times a day (BID) | ORAL | Status: DC

## 2015-02-05 DIAGNOSIS — M961 Postlaminectomy syndrome, not elsewhere classified: Secondary | ICD-10-CM | POA: Diagnosis not present

## 2015-02-05 DIAGNOSIS — M5030 Other cervical disc degeneration, unspecified cervical region: Secondary | ICD-10-CM | POA: Diagnosis not present

## 2015-02-05 DIAGNOSIS — M5136 Other intervertebral disc degeneration, lumbar region: Secondary | ICD-10-CM | POA: Diagnosis not present

## 2015-02-05 DIAGNOSIS — M47812 Spondylosis without myelopathy or radiculopathy, cervical region: Secondary | ICD-10-CM | POA: Diagnosis not present

## 2015-02-11 DIAGNOSIS — M5030 Other cervical disc degeneration, unspecified cervical region: Secondary | ICD-10-CM | POA: Diagnosis not present

## 2015-02-11 DIAGNOSIS — M79602 Pain in left arm: Secondary | ICD-10-CM | POA: Diagnosis not present

## 2015-02-11 DIAGNOSIS — M47812 Spondylosis without myelopathy or radiculopathy, cervical region: Secondary | ICD-10-CM | POA: Diagnosis not present

## 2015-02-11 DIAGNOSIS — M546 Pain in thoracic spine: Secondary | ICD-10-CM | POA: Diagnosis not present

## 2015-02-19 DIAGNOSIS — M47892 Other spondylosis, cervical region: Secondary | ICD-10-CM | POA: Diagnosis not present

## 2015-02-19 DIAGNOSIS — M546 Pain in thoracic spine: Secondary | ICD-10-CM | POA: Diagnosis not present

## 2015-02-19 DIAGNOSIS — M79602 Pain in left arm: Secondary | ICD-10-CM | POA: Diagnosis not present

## 2015-02-23 DIAGNOSIS — M47892 Other spondylosis, cervical region: Secondary | ICD-10-CM | POA: Diagnosis not present

## 2015-02-23 DIAGNOSIS — M546 Pain in thoracic spine: Secondary | ICD-10-CM | POA: Diagnosis not present

## 2015-02-23 DIAGNOSIS — M79602 Pain in left arm: Secondary | ICD-10-CM | POA: Diagnosis not present

## 2015-03-01 DIAGNOSIS — M47892 Other spondylosis, cervical region: Secondary | ICD-10-CM | POA: Diagnosis not present

## 2015-03-01 DIAGNOSIS — M79602 Pain in left arm: Secondary | ICD-10-CM | POA: Diagnosis not present

## 2015-03-01 DIAGNOSIS — M546 Pain in thoracic spine: Secondary | ICD-10-CM | POA: Diagnosis not present

## 2015-03-04 DIAGNOSIS — M79602 Pain in left arm: Secondary | ICD-10-CM | POA: Diagnosis not present

## 2015-03-04 DIAGNOSIS — M47892 Other spondylosis, cervical region: Secondary | ICD-10-CM | POA: Diagnosis not present

## 2015-03-04 DIAGNOSIS — M546 Pain in thoracic spine: Secondary | ICD-10-CM | POA: Diagnosis not present

## 2015-03-15 DIAGNOSIS — M47892 Other spondylosis, cervical region: Secondary | ICD-10-CM | POA: Diagnosis not present

## 2015-03-15 DIAGNOSIS — M79602 Pain in left arm: Secondary | ICD-10-CM | POA: Diagnosis not present

## 2015-03-15 DIAGNOSIS — M546 Pain in thoracic spine: Secondary | ICD-10-CM | POA: Diagnosis not present

## 2015-03-29 DIAGNOSIS — M791 Myalgia: Secondary | ICD-10-CM | POA: Diagnosis not present

## 2015-03-29 DIAGNOSIS — F319 Bipolar disorder, unspecified: Secondary | ICD-10-CM | POA: Diagnosis not present

## 2015-03-29 DIAGNOSIS — E119 Type 2 diabetes mellitus without complications: Secondary | ICD-10-CM | POA: Diagnosis not present

## 2015-03-29 DIAGNOSIS — Z79899 Other long term (current) drug therapy: Secondary | ICD-10-CM | POA: Diagnosis not present

## 2015-03-29 DIAGNOSIS — Z888 Allergy status to other drugs, medicaments and biological substances status: Secondary | ICD-10-CM | POA: Diagnosis not present

## 2015-03-29 DIAGNOSIS — M5030 Other cervical disc degeneration, unspecified cervical region: Secondary | ICD-10-CM | POA: Diagnosis not present

## 2015-03-29 DIAGNOSIS — Z981 Arthrodesis status: Secondary | ICD-10-CM | POA: Diagnosis not present

## 2015-03-29 DIAGNOSIS — F17210 Nicotine dependence, cigarettes, uncomplicated: Secondary | ICD-10-CM | POA: Diagnosis not present

## 2015-03-29 DIAGNOSIS — M47812 Spondylosis without myelopathy or radiculopathy, cervical region: Secondary | ICD-10-CM | POA: Diagnosis not present

## 2015-03-29 DIAGNOSIS — M961 Postlaminectomy syndrome, not elsewhere classified: Secondary | ICD-10-CM | POA: Diagnosis not present

## 2015-03-29 DIAGNOSIS — G8929 Other chronic pain: Secondary | ICD-10-CM | POA: Diagnosis not present

## 2015-03-29 DIAGNOSIS — Z880 Allergy status to penicillin: Secondary | ICD-10-CM | POA: Diagnosis not present

## 2015-03-29 DIAGNOSIS — Z882 Allergy status to sulfonamides status: Secondary | ICD-10-CM | POA: Diagnosis not present

## 2015-03-29 DIAGNOSIS — M47816 Spondylosis without myelopathy or radiculopathy, lumbar region: Secondary | ICD-10-CM | POA: Diagnosis not present

## 2015-03-29 DIAGNOSIS — M5136 Other intervertebral disc degeneration, lumbar region: Secondary | ICD-10-CM | POA: Diagnosis not present

## 2015-03-29 DIAGNOSIS — M545 Low back pain: Secondary | ICD-10-CM | POA: Diagnosis not present

## 2015-03-30 DIAGNOSIS — M47892 Other spondylosis, cervical region: Secondary | ICD-10-CM | POA: Diagnosis not present

## 2015-03-30 DIAGNOSIS — M79602 Pain in left arm: Secondary | ICD-10-CM | POA: Diagnosis not present

## 2015-03-30 DIAGNOSIS — M546 Pain in thoracic spine: Secondary | ICD-10-CM | POA: Diagnosis not present

## 2015-04-01 DIAGNOSIS — M79602 Pain in left arm: Secondary | ICD-10-CM | POA: Diagnosis not present

## 2015-04-01 DIAGNOSIS — M47892 Other spondylosis, cervical region: Secondary | ICD-10-CM | POA: Diagnosis not present

## 2015-04-01 DIAGNOSIS — M546 Pain in thoracic spine: Secondary | ICD-10-CM | POA: Diagnosis not present

## 2015-04-08 DIAGNOSIS — M79661 Pain in right lower leg: Secondary | ICD-10-CM | POA: Diagnosis not present

## 2015-04-08 DIAGNOSIS — F319 Bipolar disorder, unspecified: Secondary | ICD-10-CM | POA: Diagnosis not present

## 2015-04-08 DIAGNOSIS — I83811 Varicose veins of right lower extremities with pain: Secondary | ICD-10-CM | POA: Diagnosis not present

## 2015-04-08 DIAGNOSIS — M79604 Pain in right leg: Secondary | ICD-10-CM | POA: Diagnosis not present

## 2015-04-08 DIAGNOSIS — J45909 Unspecified asthma, uncomplicated: Secondary | ICD-10-CM | POA: Diagnosis not present

## 2015-04-08 DIAGNOSIS — I8391 Asymptomatic varicose veins of right lower extremity: Secondary | ICD-10-CM | POA: Diagnosis not present

## 2015-04-08 DIAGNOSIS — R7989 Other specified abnormal findings of blood chemistry: Secondary | ICD-10-CM | POA: Diagnosis not present

## 2015-04-08 DIAGNOSIS — F17200 Nicotine dependence, unspecified, uncomplicated: Secondary | ICD-10-CM | POA: Diagnosis not present

## 2015-04-08 DIAGNOSIS — E119 Type 2 diabetes mellitus without complications: Secondary | ICD-10-CM | POA: Diagnosis not present

## 2015-04-08 DIAGNOSIS — R791 Abnormal coagulation profile: Secondary | ICD-10-CM | POA: Diagnosis not present

## 2015-04-08 DIAGNOSIS — Z79899 Other long term (current) drug therapy: Secondary | ICD-10-CM | POA: Diagnosis not present

## 2015-04-09 DIAGNOSIS — M79604 Pain in right leg: Secondary | ICD-10-CM | POA: Diagnosis not present

## 2015-04-09 DIAGNOSIS — M7989 Other specified soft tissue disorders: Secondary | ICD-10-CM | POA: Diagnosis not present

## 2015-04-13 DIAGNOSIS — M47892 Other spondylosis, cervical region: Secondary | ICD-10-CM | POA: Diagnosis not present

## 2015-04-13 DIAGNOSIS — M79602 Pain in left arm: Secondary | ICD-10-CM | POA: Diagnosis not present

## 2015-04-13 DIAGNOSIS — M546 Pain in thoracic spine: Secondary | ICD-10-CM | POA: Diagnosis not present

## 2015-04-21 DIAGNOSIS — M5030 Other cervical disc degeneration, unspecified cervical region: Secondary | ICD-10-CM | POA: Diagnosis not present

## 2015-04-21 DIAGNOSIS — M961 Postlaminectomy syndrome, not elsewhere classified: Secondary | ICD-10-CM | POA: Diagnosis not present

## 2015-04-27 DIAGNOSIS — F319 Bipolar disorder, unspecified: Secondary | ICD-10-CM | POA: Diagnosis not present

## 2015-05-04 ENCOUNTER — Encounter: Payer: Self-pay | Admitting: Family Medicine

## 2015-05-04 ENCOUNTER — Ambulatory Visit (INDEPENDENT_AMBULATORY_CARE_PROVIDER_SITE_OTHER): Payer: Medicare Other | Admitting: Family Medicine

## 2015-05-04 VITALS — BP 138/68 | HR 78 | Temp 98.0°F | Resp 16 | Ht 67.0 in | Wt 178.0 lb

## 2015-05-04 DIAGNOSIS — Z720 Tobacco use: Secondary | ICD-10-CM

## 2015-05-04 DIAGNOSIS — L84 Corns and callosities: Secondary | ICD-10-CM

## 2015-05-04 DIAGNOSIS — I2510 Atherosclerotic heart disease of native coronary artery without angina pectoris: Secondary | ICD-10-CM

## 2015-05-04 DIAGNOSIS — J0100 Acute maxillary sinusitis, unspecified: Secondary | ICD-10-CM | POA: Diagnosis not present

## 2015-05-04 DIAGNOSIS — I10 Essential (primary) hypertension: Secondary | ICD-10-CM | POA: Diagnosis not present

## 2015-05-04 DIAGNOSIS — E119 Type 2 diabetes mellitus without complications: Secondary | ICD-10-CM

## 2015-05-04 LAB — CBC WITH DIFFERENTIAL/PLATELET
BASOS ABS: 0.1 10*3/uL (ref 0.0–0.1)
Basophils Relative: 1 % (ref 0–1)
Eosinophils Absolute: 0.4 10*3/uL (ref 0.0–0.7)
Eosinophils Relative: 4 % (ref 0–5)
HEMATOCRIT: 40.8 % (ref 36.0–46.0)
Hemoglobin: 13.8 g/dL (ref 12.0–15.0)
LYMPHS PCT: 34 % (ref 12–46)
Lymphs Abs: 3.4 10*3/uL (ref 0.7–4.0)
MCH: 29.8 pg (ref 26.0–34.0)
MCHC: 33.8 g/dL (ref 30.0–36.0)
MCV: 88.1 fL (ref 78.0–100.0)
MONO ABS: 0.4 10*3/uL (ref 0.1–1.0)
MPV: 10.9 fL (ref 8.6–12.4)
Monocytes Relative: 4 % (ref 3–12)
Neutro Abs: 5.6 10*3/uL (ref 1.7–7.7)
Neutrophils Relative %: 57 % (ref 43–77)
Platelets: 257 10*3/uL (ref 150–400)
RBC: 4.63 MIL/uL (ref 3.87–5.11)
RDW: 14.4 % (ref 11.5–15.5)
WBC: 9.9 10*3/uL (ref 4.0–10.5)

## 2015-05-04 LAB — LIPID PANEL
CHOLESTEROL: 179 mg/dL (ref 125–200)
HDL: 29 mg/dL — ABNORMAL LOW (ref >=46)
LDL Cholesterol: 105 mg/dL (ref <130)
TRIGLYCERIDES: 227 mg/dL — AB (ref <150)
Total CHOL/HDL Ratio: 6.2 Ratio — ABNORMAL HIGH (ref <=5.0)
VLDL: 45 mg/dL — AB (ref <30)

## 2015-05-04 LAB — COMPREHENSIVE METABOLIC PANEL
ALK PHOS: 103 U/L (ref 33–130)
ALT: 16 U/L (ref 6–29)
AST: 19 U/L (ref 10–35)
Albumin: 4 g/dL (ref 3.6–5.1)
BUN: 10 mg/dL (ref 7–25)
CALCIUM: 9.1 mg/dL (ref 8.6–10.4)
CHLORIDE: 102 mmol/L (ref 98–110)
CO2: 25 mmol/L (ref 20–31)
Creat: 0.9 mg/dL (ref 0.50–1.05)
GLUCOSE: 224 mg/dL — AB (ref 70–99)
Potassium: 4.4 mmol/L (ref 3.5–5.3)
Sodium: 137 mmol/L (ref 135–146)
Total Bilirubin: 0.4 mg/dL (ref 0.2–1.2)
Total Protein: 6.9 g/dL (ref 6.1–8.1)

## 2015-05-04 MED ORDER — AZITHROMYCIN 250 MG PO TABS: ORAL_TABLET | ORAL | Status: DC

## 2015-05-04 MED ORDER — METFORMIN HCL 1000 MG PO TABS: 1000.0000 mg | ORAL_TABLET | Freq: Two times a day (BID) | ORAL | Status: DC

## 2015-05-04 NOTE — Assessment & Plan Note (Signed)
Uncontrolled DM, due to medication non compliance, discussed again that her diabetes medication are likley less expensive than her pain medication and that diabetes complications in her cardiac issues in lieu of everything else are more important than her pain. She states that she will try to do better checking her sugars and taking her medication on a regular basis. She will also need diabetic shoes due to the pre-ulcerative  lesions that she has how also get her with the podiatrist.

## 2015-05-04 NOTE — Patient Instructions (Addendum)
Diabetic Shoes  Podiatry referral  Zpak  We will with lab results Metformin 1000mg  twice a day  Reschedule Physical - 2-3 Months

## 2015-05-04 NOTE — Progress Notes (Signed)
Patient ID: Erica Herrera, female   DOB: 08-Nov-1965, 49 y.o.   MRN: [id number]   Subjective:    Patient ID: Erica Herrera, female    DOB: [birth year], 49 y.o.   MRN: [id number]  Patient presents for CPE and Illness  A she was initially here for complete physical exam however she wants to defer this because of her acute illness. She's had sore throat but proceeded to sinus pressure or congestion and headache for the past 2 weeks. She has postnasal drip as well as mild cough with production. He has not required the use of her albuterol. She does continue to smoke. She's had a low-grade fever to 100F. She's tried using some over-the-counter nasal saline.  Diabetes mellitus did not been checking her blood sugars or taking her medicines on a regular basis at the last visit her A1c was 11.3% she is now taking her metformin most days for report. She did not bring her meter with her.  She is still being followed by pain clinic and her psychiatrist is now filling her bipolar medications.    Review Of Systems:  GEN- denies fatigue, fever, weight loss,weakness, recent illness HEENT- denies eye drainage, change in vision, +nasal discharge, CVS- denies chest pain, palpitations RESP- denies SOB, cough, wheeze ABD- denies N/V, change in stools, abd pain GU- denies dysuria, hematuria, dribbling, incontinence MSK- + joint pain, muscle aches, injury Neuro- denies headache, dizziness, syncope, seizure activity       Objective:    BP 138/68 mmHg  Pulse 78  Temp(Src) 98 F (36.7 C) (Oral)  Resp 16  Ht 5\' 7"  (1.702 m)  Wt 178 lb (80.74 kg)  BMI 27.87 kg/m2  LMP 03/23/2015 (Approximate) GEN- NAD, alert and oriented x3 HEENT- PERRL, EOMI, non injected sclera, pink conjunctiva, MMM, oropharynx clear,TM clear bilat  TTP maxillary sinus, nares thick rhinorrhea Neck- Supple, no thyromegaly CVS- RRR, no murmur RESP-occasional wheeze, normal WOB, clear otherwise  EXT- No edema Pulses- Radial, DP- 2+,   Skin- bilat preulcerative callus both great toes         Assessment & Plan:      Problem List Items Addressed This Visit    Tobacco user - Primary   DM (diabetes mellitus)   Relevant Medications   metFORMIN (GLUCOPHAGE) 1000 MG tablet   Other Relevant Orders   CBC with Differential/Platelet   Comprehensive metabolic panel   Hemoglobin A1c   Lipid panel    Other Visit Diagnoses    Acute maxillary sinusitis, recurrence not specified        zpak, OTC mucous relief, use albuterol if chest symptoms worsen    Relevant Medications    azithromycin (ZITHROMAX) 250 MG tablet       Note: This dictation was prepared with Dragon dictation along with smaller phrase technology. Any transcriptional errors that result from this process are unintentional.

## 2015-05-04 NOTE — Assessment & Plan Note (Signed)
Well controlled, no current meds

## 2015-05-05 ENCOUNTER — Other Ambulatory Visit: Payer: Self-pay | Admitting: *Deleted

## 2015-05-05 LAB — HEMOGLOBIN A1C
HEMOGLOBIN A1C: 8.7 % — AB (ref <5.7)
Mean Plasma Glucose: 203 mg/dL — ABNORMAL HIGH (ref <117)

## 2015-05-05 MED ORDER — CANAGLIFLOZIN 100 MG PO TABS: 100.0000 mg | ORAL_TABLET | Freq: Every day | ORAL | Status: DC

## 2015-05-05 MED ORDER — FLUCONAZOLE 150 MG PO TABS: 150.0000 mg | ORAL_TABLET | Freq: Once | ORAL | Status: DC

## 2015-05-10 ENCOUNTER — Encounter: Payer: Self-pay | Admitting: Family Medicine

## 2015-05-10 ENCOUNTER — Other Ambulatory Visit: Payer: Self-pay | Admitting: *Deleted

## 2015-05-10 MED ORDER — LORATADINE 10 MG PO TABS: 10.0000 mg | ORAL_TABLET | Freq: Every day | ORAL | Status: DC

## 2015-06-30 ENCOUNTER — Ambulatory Visit: Payer: Medicare Other | Admitting: Podiatry

## 2015-07-07 ENCOUNTER — Encounter: Payer: Self-pay | Admitting: Family Medicine

## 2015-07-07 ENCOUNTER — Ambulatory Visit (INDEPENDENT_AMBULATORY_CARE_PROVIDER_SITE_OTHER): Payer: Medicare Other | Admitting: Podiatry

## 2015-07-07 ENCOUNTER — Ambulatory Visit: Payer: Medicare Other | Admitting: Podiatry

## 2015-07-07 ENCOUNTER — Encounter: Payer: Medicare Other | Admitting: Family Medicine

## 2015-07-07 ENCOUNTER — Encounter: Payer: Self-pay | Admitting: Podiatry

## 2015-07-07 VITALS — BP 127/66 | HR 65 | Resp 12

## 2015-07-07 DIAGNOSIS — Q828 Other specified congenital malformations of skin: Secondary | ICD-10-CM

## 2015-07-07 DIAGNOSIS — I2510 Atherosclerotic heart disease of native coronary artery without angina pectoris: Secondary | ICD-10-CM | POA: Diagnosis not present

## 2015-07-07 NOTE — Progress Notes (Signed)
   Subjective:    Patient ID: Erica Herrera, female    DOB: 10/05/1965, 49 y.o.   MRN: [id number]  HPI this patient presents the office with chief complaint of painful calluses on the bottom of both feet. She states that  she does the nails herself. She states that she has developed calluses on the bottom of both feet and she trims the calluses, with a blade that was given to her by a previous Dr. She states that the callus is now developing in multiple areas on her feet.. She states that her medical doctor is attempting to get her diabetic shoes but has been unsuccessful at this time. She presents to the office for an evaluation of the calluses on the bottom of both feet  The patient is here today with B/L bottom of foot pain and B/L toenail debridement.  Review of Systems  All other systems reviewed and are negative.      Objective:   Physical Exam GENERAL APPEARANCE: Alert, conversant. Appropriately groomed. No acute distress.  VASCULAR: Pedal pulses palpable at  [facility] and PT bilateral.  Capillary refill time is immediate to all digits,  Normal temperature gradient.  Digital hair growth is present bilateral  NEUROLOGIC: sensation is normal to 5.07 monofilament at 5/5 sites bilateral.  Light touch is intact bilateral, Muscle strength normal.  MUSCULOSKELETAL: acceptable muscle strength, tone and stability bilateral.  Intrinsic muscluature intact bilateral.  Rectus appearance of foot and digits noted bilateral.   DERMATOLOGIC: skin color, texture, and turgor are within normal limits.  No preulcerative lesions or ulcers  are seen, no interdigital maceration noted.  No open lesions present.  Digital nails are asymptomatic. No drainage noted.Porokeratotic lesions sub1 B/L and sub 5th right foot.  Porokeratosis under left hallux.         Assessment & Plan:  Porokeratosis B/L  IEDebride porokeratosis B/L  Dispense diabetic insoles.  RTC prn.Helane Gunther DPM

## 2015-07-18 DIAGNOSIS — Z880 Allergy status to penicillin: Secondary | ICD-10-CM | POA: Diagnosis not present

## 2015-07-18 DIAGNOSIS — R404 Transient alteration of awareness: Secondary | ICD-10-CM | POA: Diagnosis not present

## 2015-07-18 DIAGNOSIS — M6281 Muscle weakness (generalized): Secondary | ICD-10-CM | POA: Diagnosis not present

## 2015-07-18 DIAGNOSIS — F17210 Nicotine dependence, cigarettes, uncomplicated: Secondary | ICD-10-CM | POA: Diagnosis not present

## 2015-07-18 DIAGNOSIS — I639 Cerebral infarction, unspecified: Secondary | ICD-10-CM | POA: Diagnosis not present

## 2015-07-18 DIAGNOSIS — Z9049 Acquired absence of other specified parts of digestive tract: Secondary | ICD-10-CM | POA: Diagnosis not present

## 2015-07-18 DIAGNOSIS — Z885 Allergy status to narcotic agent status: Secondary | ICD-10-CM | POA: Diagnosis not present

## 2015-07-18 DIAGNOSIS — E785 Hyperlipidemia, unspecified: Secondary | ICD-10-CM | POA: Diagnosis not present

## 2015-07-18 DIAGNOSIS — T50991A Poisoning by other drugs, medicaments and biological substances, accidental (unintentional), initial encounter: Secondary | ICD-10-CM | POA: Diagnosis not present

## 2015-07-18 DIAGNOSIS — R200 Anesthesia of skin: Secondary | ICD-10-CM | POA: Diagnosis not present

## 2015-07-18 DIAGNOSIS — F329 Major depressive disorder, single episode, unspecified: Secondary | ICD-10-CM | POA: Diagnosis not present

## 2015-07-18 DIAGNOSIS — E119 Type 2 diabetes mellitus without complications: Secondary | ICD-10-CM | POA: Diagnosis not present

## 2015-07-18 DIAGNOSIS — R4181 Age-related cognitive decline: Secondary | ICD-10-CM | POA: Diagnosis not present

## 2015-07-18 DIAGNOSIS — Z9089 Acquired absence of other organs: Secondary | ICD-10-CM | POA: Diagnosis not present

## 2015-07-18 DIAGNOSIS — R0689 Other abnormalities of breathing: Secondary | ICD-10-CM | POA: Diagnosis not present

## 2015-07-18 DIAGNOSIS — R410 Disorientation, unspecified: Secondary | ICD-10-CM | POA: Diagnosis not present

## 2015-07-18 DIAGNOSIS — Z882 Allergy status to sulfonamides status: Secondary | ICD-10-CM | POA: Diagnosis not present

## 2015-07-18 DIAGNOSIS — R4182 Altered mental status, unspecified: Secondary | ICD-10-CM | POA: Diagnosis not present

## 2015-07-22 ENCOUNTER — Telehealth: Payer: Self-pay | Admitting: *Deleted

## 2015-07-22 MED ORDER — ALBUTEROL SULFATE HFA 108 (90 BASE) MCG/ACT IN AERS: 2.0000 | INHALATION_SPRAY | Freq: Four times a day (QID) | RESPIRATORY_TRACT | Status: DC | PRN

## 2015-07-22 NOTE — Telephone Encounter (Signed)
Received fax from insurance regarding the prescription for ProAir.   Insurance will not cover this medication.   MD made aware and recommendations are as follows:  D/C ProAir. Begin Ventolin.   Prescription sent to pharmacy.   

## 2015-08-18 DIAGNOSIS — F319 Bipolar disorder, unspecified: Secondary | ICD-10-CM | POA: Diagnosis not present

## 2015-09-16 DIAGNOSIS — M961 Postlaminectomy syndrome, not elsewhere classified: Secondary | ICD-10-CM | POA: Diagnosis not present

## 2015-09-16 DIAGNOSIS — M545 Low back pain: Secondary | ICD-10-CM | POA: Diagnosis not present

## 2015-09-16 DIAGNOSIS — M47812 Spondylosis without myelopathy or radiculopathy, cervical region: Secondary | ICD-10-CM | POA: Diagnosis not present

## 2015-09-16 DIAGNOSIS — M47816 Spondylosis without myelopathy or radiculopathy, lumbar region: Secondary | ICD-10-CM | POA: Diagnosis not present

## 2015-10-13 DIAGNOSIS — M545 Low back pain: Secondary | ICD-10-CM | POA: Diagnosis not present

## 2015-10-13 DIAGNOSIS — F319 Bipolar disorder, unspecified: Secondary | ICD-10-CM | POA: Diagnosis not present

## 2015-10-13 DIAGNOSIS — I252 Old myocardial infarction: Secondary | ICD-10-CM | POA: Diagnosis not present

## 2015-10-13 DIAGNOSIS — G8929 Other chronic pain: Secondary | ICD-10-CM | POA: Diagnosis not present

## 2015-10-13 DIAGNOSIS — Z981 Arthrodesis status: Secondary | ICD-10-CM | POA: Diagnosis not present

## 2015-10-13 DIAGNOSIS — E119 Type 2 diabetes mellitus without complications: Secondary | ICD-10-CM | POA: Diagnosis not present

## 2015-10-13 DIAGNOSIS — J45909 Unspecified asthma, uncomplicated: Secondary | ICD-10-CM | POA: Diagnosis not present

## 2015-10-13 DIAGNOSIS — F17200 Nicotine dependence, unspecified, uncomplicated: Secondary | ICD-10-CM | POA: Diagnosis not present

## 2015-10-13 DIAGNOSIS — Z79899 Other long term (current) drug therapy: Secondary | ICD-10-CM | POA: Diagnosis not present

## 2015-10-18 DIAGNOSIS — F319 Bipolar disorder, unspecified: Secondary | ICD-10-CM | POA: Diagnosis not present

## 2015-10-18 DIAGNOSIS — M47812 Spondylosis without myelopathy or radiculopathy, cervical region: Secondary | ICD-10-CM | POA: Diagnosis not present

## 2015-10-18 DIAGNOSIS — E119 Type 2 diabetes mellitus without complications: Secondary | ICD-10-CM | POA: Diagnosis not present

## 2015-10-18 DIAGNOSIS — M545 Low back pain: Secondary | ICD-10-CM | POA: Diagnosis not present

## 2015-10-18 DIAGNOSIS — Z79899 Other long term (current) drug therapy: Secondary | ICD-10-CM | POA: Diagnosis not present

## 2015-10-18 DIAGNOSIS — M47816 Spondylosis without myelopathy or radiculopathy, lumbar region: Secondary | ICD-10-CM | POA: Diagnosis not present

## 2015-10-18 DIAGNOSIS — G8929 Other chronic pain: Secondary | ICD-10-CM | POA: Diagnosis not present

## 2015-10-18 DIAGNOSIS — Z880 Allergy status to penicillin: Secondary | ICD-10-CM | POA: Diagnosis not present

## 2015-10-18 DIAGNOSIS — Z981 Arthrodesis status: Secondary | ICD-10-CM | POA: Diagnosis not present

## 2015-10-18 DIAGNOSIS — Z888 Allergy status to other drugs, medicaments and biological substances status: Secondary | ICD-10-CM | POA: Diagnosis not present

## 2015-10-18 DIAGNOSIS — F17210 Nicotine dependence, cigarettes, uncomplicated: Secondary | ICD-10-CM | POA: Diagnosis not present

## 2015-10-18 DIAGNOSIS — Z882 Allergy status to sulfonamides status: Secondary | ICD-10-CM | POA: Diagnosis not present

## 2015-10-18 DIAGNOSIS — M791 Myalgia: Secondary | ICD-10-CM | POA: Diagnosis not present

## 2015-10-18 DIAGNOSIS — Z79891 Long term (current) use of opiate analgesic: Secondary | ICD-10-CM | POA: Diagnosis not present

## 2015-10-18 DIAGNOSIS — M961 Postlaminectomy syndrome, not elsewhere classified: Secondary | ICD-10-CM | POA: Diagnosis not present

## 2015-10-18 DIAGNOSIS — Z7984 Long term (current) use of oral hypoglycemic drugs: Secondary | ICD-10-CM | POA: Diagnosis not present

## 2015-11-02 DIAGNOSIS — J441 Chronic obstructive pulmonary disease with (acute) exacerbation: Secondary | ICD-10-CM | POA: Diagnosis not present

## 2015-11-02 DIAGNOSIS — E119 Type 2 diabetes mellitus without complications: Secondary | ICD-10-CM | POA: Diagnosis not present

## 2015-11-02 DIAGNOSIS — R05 Cough: Secondary | ICD-10-CM | POA: Diagnosis not present

## 2015-11-02 DIAGNOSIS — F17200 Nicotine dependence, unspecified, uncomplicated: Secondary | ICD-10-CM | POA: Diagnosis not present

## 2015-11-02 DIAGNOSIS — Z79899 Other long term (current) drug therapy: Secondary | ICD-10-CM | POA: Diagnosis not present

## 2015-11-02 DIAGNOSIS — I252 Old myocardial infarction: Secondary | ICD-10-CM | POA: Diagnosis not present

## 2015-11-02 DIAGNOSIS — Z7984 Long term (current) use of oral hypoglycemic drugs: Secondary | ICD-10-CM | POA: Diagnosis not present

## 2015-11-02 DIAGNOSIS — J4520 Mild intermittent asthma, uncomplicated: Secondary | ICD-10-CM | POA: Diagnosis not present

## 2015-11-03 ENCOUNTER — Telehealth: Payer: Self-pay | Admitting: Family Medicine

## 2015-11-04 ENCOUNTER — Ambulatory Visit (INDEPENDENT_AMBULATORY_CARE_PROVIDER_SITE_OTHER): Payer: Medicare Other | Admitting: Family Medicine

## 2015-11-04 ENCOUNTER — Encounter: Payer: Self-pay | Admitting: Family Medicine

## 2015-11-04 VITALS — BP 116/76 | HR 68 | Temp 98.5°F | Resp 18 | Wt 166.5 lb

## 2015-11-04 DIAGNOSIS — I10 Essential (primary) hypertension: Secondary | ICD-10-CM | POA: Diagnosis not present

## 2015-11-04 DIAGNOSIS — G8929 Other chronic pain: Secondary | ICD-10-CM | POA: Diagnosis not present

## 2015-11-04 DIAGNOSIS — E119 Type 2 diabetes mellitus without complications: Secondary | ICD-10-CM

## 2015-11-04 DIAGNOSIS — M549 Dorsalgia, unspecified: Secondary | ICD-10-CM

## 2015-11-04 DIAGNOSIS — Z720 Tobacco use: Secondary | ICD-10-CM

## 2015-11-04 LAB — COMPREHENSIVE METABOLIC PANEL
ALK PHOS: 80 U/L (ref 33–130)
ALT: 12 U/L (ref 6–29)
AST: 13 U/L (ref 10–35)
Albumin: 4.1 g/dL (ref 3.6–5.1)
BUN: 11 mg/dL (ref 7–25)
CALCIUM: 9.5 mg/dL (ref 8.6–10.4)
CHLORIDE: 101 mmol/L (ref 98–110)
CO2: 24 mmol/L (ref 20–31)
Creat: 0.84 mg/dL (ref 0.50–1.05)
GLUCOSE: 142 mg/dL — AB (ref 70–99)
POTASSIUM: 4.6 mmol/L (ref 3.5–5.3)
Sodium: 139 mmol/L (ref 135–146)
Total Bilirubin: 0.4 mg/dL (ref 0.2–1.2)
Total Protein: 8 g/dL (ref 6.1–8.1)

## 2015-11-04 LAB — CBC WITH DIFFERENTIAL/PLATELET
BASOS ABS: 0 10*3/uL (ref 0.0–0.1)
Basophils Relative: 0 % (ref 0–1)
EOS ABS: 0 10*3/uL (ref 0.0–0.7)
EOS PCT: 0 % (ref 0–5)
HEMATOCRIT: 42.5 % (ref 36.0–46.0)
Hemoglobin: 14.5 g/dL (ref 12.0–15.0)
LYMPHS ABS: 3.5 10*3/uL (ref 0.7–4.0)
LYMPHS PCT: 28 % (ref 12–46)
MCH: 29.9 pg (ref 26.0–34.0)
MCHC: 34.1 g/dL (ref 30.0–36.0)
MCV: 87.6 fL (ref 78.0–100.0)
MONO ABS: 0.6 10*3/uL (ref 0.1–1.0)
MONOS PCT: 5 % (ref 3–12)
MPV: 10.5 fL (ref 8.6–12.4)
Neutro Abs: 8.3 10*3/uL — ABNORMAL HIGH (ref 1.7–7.7)
Neutrophils Relative %: 67 % (ref 43–77)
PLATELETS: 440 10*3/uL — AB (ref 150–400)
RBC: 4.85 MIL/uL (ref 3.87–5.11)
RDW: 14.6 % (ref 11.5–15.5)
WBC: 12.4 10*3/uL — ABNORMAL HIGH (ref 4.0–10.5)

## 2015-11-04 MED ORDER — NICOTINE 21 MG/24HR TD PT24: 21.0000 mg | MEDICATED_PATCH | Freq: Every day | TRANSDERMAL | Status: DC

## 2015-11-04 NOTE — Assessment & Plan Note (Signed)
She also requested nicotine patches she smokes 1 pack per day

## 2015-11-04 NOTE — Patient Instructions (Signed)
We will call with lab results Continue current medications Nicotine patches given  F/U 3 months

## 2015-11-04 NOTE — Assessment & Plan Note (Signed)
She will complete her course of medications for her bronchitis. Regards her chronic back pain she continues with her pain clinic. I will release her to go back to work as she is staying within the guidelines for Medicare/disability. She states her goal is actually come off of disability. I did not give her any restrictions

## 2015-11-04 NOTE — Progress Notes (Signed)
Patient ID: Erica Herrera, female   DOB: July 13, 1966, 50 y.o.   MRN: [id number]   Subjective:    Patient ID: Erica Herrera, female    DOB: May 10, 1966, 50 y.o.   MRN: [id number]  Patient presents for OTHER Patient here because she needs a note for work. 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 She has contacted her caseworker and they are aware that she is working even though she is on disability. She is not exceeding any guidelines. She had an incident where her back flared up which is one of the main reason she is on disability and she was sent home advised that she needed a note to return to work. She still under pain clinic for her chronic neck and back pain she is also still seen her psychiatrist.   Diabetes mellitus she has lost weight intentionally due to her increased activity with working her weight is down 12 pounds since last September. She is only on metformin states that she cannot afford the interval, her blood sugars have been ranging 1 2130s fasting since she lost the weight. She is overdue for A1c her last one was 8.7%  She was recently in the emergency room at [facility] secondary to acute bronchitis she is currently on Levaquin and prednisone    Review Of Systems:  GEN- denies fatigue, fever, weight loss,weakness, recent illness HEENT- denies eye drainage, change in vision, +nasal discharge, CVS- denies chest pain, palpitations RESP- denies SOB, +cough, +wheeze ABD- denies N/V, change in stools, abd pain GU- denies dysuria, hematuria, dribbling, incontinence MSK- + joint pain, muscle aches, injury Neuro- denies headache, dizziness, syncope, seizure activity       Objective:    BP 116/76 mmHg  Pulse 68  Temp(Src) 98.5 F (36.9 C) (Oral)  Resp 18  Wt 166 lb 8 oz (75.524 kg) GEN- NAD, alert and oriented x3 HEENT- PERRL, EOMI, non injected sclera, pink conjunctiva, MMM, oropharynx clear,TM clear bilat  TTP  maxillary sinus, nares thick rhinorrhea Neck- Supple, no thyromegaly, no LAD  CVS- RRR, no murmur RESP- bilat wheeze no rales, , normal WOB, EXT- No edema Pulses- Radial, DP- 2+     Assessment & Plan:      Problem List Items Addressed This Visit    Essential hypertension, benign    Blood pressure well controlled today metabolic panel      DM (diabetes mellitus) (HCC) - Primary    We'll recheck her A1c she may not need Invokana she has lost 12 pounds and her fasting blood sugars seem to have come down.      Relevant Orders   CBC with Differential/Platelet   Comprehensive metabolic panel   Hemoglobin A1c   Chronic back pain    She will complete her course of medications for her bronchitis. Regards her chronic back pain she continues with her pain clinic. I will release her to go back to work as she is staying within the guidelines for Medicare/disability. She states her goal is actually come off of disability. I did not give her any restrictions      Relevant Medications   predniSONE (DELTASONE) 20 MG tablet      Note: This dictation was prepared with Dragon dictation along with smaller phrase technology. Any transcriptional errors that result from this process are unintentional.

## 2015-11-04 NOTE — Assessment & Plan Note (Signed)
We'll recheck her A1c she may not need Invokana she has lost 12 pounds and her fasting blood sugars seem to have come down.

## 2015-11-04 NOTE — Assessment & Plan Note (Signed)
Blood pressure well controlled today metabolic panel

## 2015-11-05 LAB — HEMOGLOBIN A1C
HEMOGLOBIN A1C: 7.3 % — AB (ref <5.7)
MEAN PLASMA GLUCOSE: 163 mg/dL — AB (ref <117)

## 2015-11-15 ENCOUNTER — Encounter: Payer: Self-pay | Admitting: Family Medicine

## 2015-11-15 ENCOUNTER — Ambulatory Visit (INDEPENDENT_AMBULATORY_CARE_PROVIDER_SITE_OTHER): Payer: Medicare Other | Admitting: Family Medicine

## 2015-11-15 VITALS — BP 120/72 | HR 88 | Temp 98.8°F | Resp 14 | Ht 67.0 in | Wt 173.0 lb

## 2015-11-15 DIAGNOSIS — Z124 Encounter for screening for malignant neoplasm of cervix: Secondary | ICD-10-CM | POA: Diagnosis not present

## 2015-11-15 DIAGNOSIS — Z1231 Encounter for screening mammogram for malignant neoplasm of breast: Secondary | ICD-10-CM | POA: Diagnosis not present

## 2015-11-15 DIAGNOSIS — Z1159 Encounter for screening for other viral diseases: Secondary | ICD-10-CM

## 2015-11-15 DIAGNOSIS — Z0000 Encounter for general adult medical examination without abnormal findings: Secondary | ICD-10-CM | POA: Diagnosis not present

## 2015-11-15 DIAGNOSIS — J0101 Acute recurrent maxillary sinusitis: Secondary | ICD-10-CM | POA: Diagnosis not present

## 2015-11-15 DIAGNOSIS — D72829 Elevated white blood cell count, unspecified: Secondary | ICD-10-CM | POA: Diagnosis not present

## 2015-11-15 DIAGNOSIS — E785 Hyperlipidemia, unspecified: Secondary | ICD-10-CM

## 2015-11-15 DIAGNOSIS — Z1211 Encounter for screening for malignant neoplasm of colon: Secondary | ICD-10-CM | POA: Diagnosis not present

## 2015-11-15 LAB — CBC WITH DIFFERENTIAL/PLATELET
BASOS ABS: 0.1 10*3/uL (ref 0.0–0.1)
Basophils Relative: 1 % (ref 0–1)
Eosinophils Absolute: 0.3 10*3/uL (ref 0.0–0.7)
Eosinophils Relative: 2 % (ref 0–5)
HEMATOCRIT: 40.9 % (ref 36.0–46.0)
HEMOGLOBIN: 13.8 g/dL (ref 12.0–15.0)
LYMPHS ABS: 4.2 10*3/uL — AB (ref 0.7–4.0)
LYMPHS PCT: 32 % (ref 12–46)
MCH: 30.3 pg (ref 26.0–34.0)
MCHC: 33.7 g/dL (ref 30.0–36.0)
MCV: 89.9 fL (ref 78.0–100.0)
MONOS PCT: 8 % (ref 3–12)
MPV: 11 fL (ref 8.6–12.4)
Monocytes Absolute: 1 10*3/uL (ref 0.1–1.0)
NEUTROS ABS: 7.4 10*3/uL (ref 1.7–7.7)
NEUTROS PCT: 57 % (ref 43–77)
PLATELETS: 241 10*3/uL (ref 150–400)
RBC: 4.55 MIL/uL (ref 3.87–5.11)
RDW: 14.8 % (ref 11.5–15.5)
WBC: 13 10*3/uL — AB (ref 4.0–10.5)

## 2015-11-15 LAB — LIPID PANEL
CHOL/HDL RATIO: 3.6 ratio (ref <=5.0)
Cholesterol: 143 mg/dL (ref 125–200)
HDL: 40 mg/dL — AB (ref >=46)
LDL Cholesterol: 74 mg/dL (ref <130)
Triglycerides: 147 mg/dL (ref <150)
VLDL: 29 mg/dL (ref <30)

## 2015-11-15 LAB — HEPATITIS C ANTIBODY: HCV Ab: NEGATIVE

## 2015-11-15 MED ORDER — METFORMIN HCL 1000 MG PO TABS: 1000.0000 mg | ORAL_TABLET | Freq: Two times a day (BID) | ORAL | Status: DC

## 2015-11-15 MED ORDER — LEVOFLOXACIN 500 MG PO TABS: 500.0000 mg | ORAL_TABLET | Freq: Every day | ORAL | Status: DC

## 2015-11-15 MED ORDER — FLUTICASONE PROPIONATE 50 MCG/ACT NA SUSP: 2.0000 | Freq: Every day | NASAL | Status: DC

## 2015-11-15 NOTE — Patient Instructions (Addendum)
Mammogram- call for appointment Referral colonoscopy  We will call with PAP Smear and lab results  Take the antibiotics Use nasal spray F/U as previous

## 2015-11-15 NOTE — Progress Notes (Signed)
Patient ID: Erica Herrera, female   DOB: 1966/02/11, 50 y.o.   MRN: [id number] Subjective:   Patient presents for Medicare Annual/Subsequent preventive examination.  Patient for annual wellness exam. She is now ready to proceed with the preventative care. She would also like to hepatitis C screening. Her only concern is she has continued sinus pressure and drainage despite completing her antibiotics. She states it improved some and is now come back and she also has some soreness in the throat. She denies any fever and no significant cough or wheeze. Review Past Medical/Family/Social: Per EMR   Risk Factors  Current exercise habits: she stays active Dietary issues discussed: yes   Cardiac risk factors: DM, HTN   Depression Screen -- ACTIVE TREATMENT BY PSYCHIATRY (Note: if answer to either of the following is "Yes", a more complete depression screening is indicated)  Over the past two weeks, have you felt down, depressed or hopeless? No Over the past two weeks, have you felt little interest or pleasure in doing things? No Have you lost interest or pleasure in daily life? No Do you often feel hopeless? No Do you cry easily over simple problems? No   Activities of Daily Living  In your present state of health, do you have any difficulty performing the following activities?:  Driving? No  Managing money? No  Feeding yourself? No  Getting from bed to chair? No  Climbing a flight of stairs? No  Preparing food and eating?: No  Bathing or showering? No  Getting dressed: No  Getting to the toilet? No  Using the toilet:No  Moving around from place to place: No  In the past year have you fallen or had a near fall?:No  Are you sexually active? No  Do you have more than one partner? No   Hearing Difficulties: No  Do you often ask people to speak up or repeat themselves? No  Do you experience ringing or noises in your ears? No Do you have difficulty understanding soft or whispered voices?  No  Do you feel that you have a problem with memory? No Do you often misplace items? No  Do you feel safe at home? Yes  Cognitive Testing  Alert? Yes Normal Appearance?Yes  Oriented to person? Yes Place? Yes  Time? Yes  Recall of three objects? Yes  Can perform simple calculations? Yes  Displays appropriate judgment?Yes  Can read the correct time from a watch face?Yes   List the Names of Other Physician/Practitioners you currently use: Pain clinic- Dr. Laurian Brim, Psychiatry- Dr. Geanie Cooley [facility])  Screening Tests / Date Colonoscopy    Due                 Zostavax  Age 77 Mammogram Due  Tetanus/tdap UTD   ROS: GEN- denies fatigue, fever, weight loss,weakness, recent illness HEENT- denies eye drainage, change in vision, +nasal discharge, CVS- denies chest pain, palpitations RESP- denies SOB, cough, wheeze ABD- denies N/V, change in stools, abd pain GU- denies dysuria, hematuria, dribbling, incontinence MSK- + joint pain, muscle aches, injury Neuro- denies headache, dizziness, syncope, seizure activity  Physical: GEN- NAD, alert and oriented x3 HEENT- PERRL, EOMI, non injected sclera, pink conjunctiva, MMM, oropharynx clear,TM clear bilat  TTP maxillary sinus, nares thick rhinorrhea Neck- Supple, no thyromegaly, + LAD  CVS- RRR, no murmur RESP- CTAB Breast- normal symmetry, no nipple inversion,no nipple drainage, no nodules or lumps felt Nodes- no axillary nodes NABS, soft,NT,ND  GU- normal external genitalia, vaginal mucosa pink and  moist, cervix visualized no growth, no blood form os, minimal thin clear discharge, no CMT, no ovarian masses, uterus normal size Rectum- normal tone FOBT neg  EXT- No edema Pulses- Radial, DP- 2+   Assessment:    Annual wellness medicare exam   Plan:    During the course of the visit the patient was educated and counseled about appropriate screening and preventive services including:  Screening mammography  Colorectal cancer screening    Recurrent sinusitis- extend levaquin another week, add flonase   PAP Smear done today  Check lipids today    Note her white blood cell count was mildly elevated after the last visit to recheck this today Diet review for nutrition referral? Yes ____ Not Indicated __x__  Patient Instructions (the written plan) was given to the patient.  Medicare Attestation  I have personally reviewed:  The patient's medical and social history  Their use of alcohol, tobacco or illicit drugs  Their current medications and supplements  The patient's functional ability including ADLs,fall risks, home safety risks, cognitive, and hearing and visual impairment  Diet and physical activities  Evidence for depression or mood disorders  The patient's weight, height, BMI, and visual acuity have been recorded in the chart. I have made referrals, counseling, and provided education to the patient based on review of the above and I have provided the patient with a written personalized care plan for preventive services.

## 2015-11-16 LAB — PAP THINPREP ASCUS RFLX HPV RFLX TYPE

## 2015-11-18 ENCOUNTER — Encounter: Payer: Self-pay | Admitting: Family Medicine

## 2015-11-18 DIAGNOSIS — J358 Other chronic diseases of tonsils and adenoids: Secondary | ICD-10-CM

## 2015-11-18 DIAGNOSIS — Z79899 Other long term (current) drug therapy: Secondary | ICD-10-CM | POA: Diagnosis not present

## 2015-11-18 DIAGNOSIS — J45909 Unspecified asthma, uncomplicated: Secondary | ICD-10-CM | POA: Diagnosis not present

## 2015-11-18 DIAGNOSIS — F17200 Nicotine dependence, unspecified, uncomplicated: Secondary | ICD-10-CM | POA: Diagnosis not present

## 2015-11-18 DIAGNOSIS — I252 Old myocardial infarction: Secondary | ICD-10-CM | POA: Diagnosis not present

## 2015-11-18 DIAGNOSIS — J029 Acute pharyngitis, unspecified: Secondary | ICD-10-CM | POA: Diagnosis not present

## 2015-11-18 DIAGNOSIS — Z7984 Long term (current) use of oral hypoglycemic drugs: Secondary | ICD-10-CM | POA: Diagnosis not present

## 2015-11-18 DIAGNOSIS — J312 Chronic pharyngitis: Secondary | ICD-10-CM

## 2015-11-18 DIAGNOSIS — E119 Type 2 diabetes mellitus without complications: Secondary | ICD-10-CM | POA: Diagnosis not present

## 2015-11-18 DIAGNOSIS — F319 Bipolar disorder, unspecified: Secondary | ICD-10-CM | POA: Diagnosis not present

## 2015-11-24 DIAGNOSIS — M961 Postlaminectomy syndrome, not elsewhere classified: Secondary | ICD-10-CM | POA: Diagnosis not present

## 2015-11-24 DIAGNOSIS — M47812 Spondylosis without myelopathy or radiculopathy, cervical region: Secondary | ICD-10-CM | POA: Diagnosis not present

## 2015-11-24 DIAGNOSIS — M4727 Other spondylosis with radiculopathy, lumbosacral region: Secondary | ICD-10-CM | POA: Diagnosis not present

## 2015-11-24 DIAGNOSIS — M5030 Other cervical disc degeneration, unspecified cervical region: Secondary | ICD-10-CM | POA: Diagnosis not present

## 2015-11-25 ENCOUNTER — Encounter: Payer: Self-pay | Admitting: *Deleted

## 2015-11-30 DIAGNOSIS — M4727 Other spondylosis with radiculopathy, lumbosacral region: Secondary | ICD-10-CM | POA: Diagnosis not present

## 2015-12-01 DIAGNOSIS — M47816 Spondylosis without myelopathy or radiculopathy, lumbar region: Secondary | ICD-10-CM | POA: Diagnosis not present

## 2015-12-01 DIAGNOSIS — M4727 Other spondylosis with radiculopathy, lumbosacral region: Secondary | ICD-10-CM | POA: Diagnosis not present

## 2015-12-01 DIAGNOSIS — M4806 Spinal stenosis, lumbar region: Secondary | ICD-10-CM | POA: Diagnosis not present

## 2015-12-06 ENCOUNTER — Ambulatory Visit (HOSPITAL_COMMUNITY): Payer: Medicare Other

## 2015-12-15 ENCOUNTER — Encounter: Payer: Self-pay | Admitting: Family Medicine

## 2015-12-15 DIAGNOSIS — M545 Low back pain: Secondary | ICD-10-CM

## 2015-12-15 DIAGNOSIS — M542 Cervicalgia: Principal | ICD-10-CM

## 2015-12-15 DIAGNOSIS — G8929 Other chronic pain: Secondary | ICD-10-CM

## 2015-12-16 DIAGNOSIS — F319 Bipolar disorder, unspecified: Secondary | ICD-10-CM | POA: Diagnosis not present

## 2015-12-17 DIAGNOSIS — M7989 Other specified soft tissue disorders: Secondary | ICD-10-CM | POA: Diagnosis not present

## 2015-12-17 DIAGNOSIS — E119 Type 2 diabetes mellitus without complications: Secondary | ICD-10-CM | POA: Diagnosis not present

## 2015-12-17 DIAGNOSIS — Z79899 Other long term (current) drug therapy: Secondary | ICD-10-CM | POA: Diagnosis not present

## 2015-12-17 DIAGNOSIS — F17200 Nicotine dependence, unspecified, uncomplicated: Secondary | ICD-10-CM | POA: Diagnosis not present

## 2015-12-17 DIAGNOSIS — M79641 Pain in right hand: Secondary | ICD-10-CM | POA: Diagnosis not present

## 2015-12-17 DIAGNOSIS — R2231 Localized swelling, mass and lump, right upper limb: Secondary | ICD-10-CM | POA: Diagnosis not present

## 2015-12-21 ENCOUNTER — Encounter: Payer: Self-pay | Admitting: Family Medicine

## 2015-12-27 ENCOUNTER — Ambulatory Visit: Payer: Self-pay | Admitting: Family Medicine

## 2015-12-28 ENCOUNTER — Ambulatory Visit: Payer: Medicare Other | Admitting: Family Medicine

## 2016-01-01 ENCOUNTER — Encounter: Payer: Self-pay | Admitting: Family Medicine

## 2016-01-03 ENCOUNTER — Ambulatory Visit: Payer: Medicare Other | Admitting: Family Medicine

## 2016-01-04 ENCOUNTER — Encounter: Payer: Self-pay | Admitting: Family Medicine

## 2016-01-04 ENCOUNTER — Encounter: Payer: Self-pay | Admitting: *Deleted

## 2016-01-04 ENCOUNTER — Ambulatory Visit (INDEPENDENT_AMBULATORY_CARE_PROVIDER_SITE_OTHER): Payer: Medicare Other | Admitting: Family Medicine

## 2016-01-04 VITALS — BP 138/74 | HR 82 | Temp 98.7°F | Resp 14 | Ht 67.0 in | Wt 187.0 lb

## 2016-01-04 DIAGNOSIS — M47816 Spondylosis without myelopathy or radiculopathy, lumbar region: Secondary | ICD-10-CM

## 2016-01-04 DIAGNOSIS — G8929 Other chronic pain: Secondary | ICD-10-CM | POA: Diagnosis not present

## 2016-01-04 DIAGNOSIS — G5603 Carpal tunnel syndrome, bilateral upper limbs: Secondary | ICD-10-CM | POA: Diagnosis not present

## 2016-01-04 DIAGNOSIS — M5136 Other intervertebral disc degeneration, lumbar region: Secondary | ICD-10-CM

## 2016-01-04 DIAGNOSIS — M6530 Trigger finger, unspecified finger: Secondary | ICD-10-CM | POA: Diagnosis not present

## 2016-01-04 DIAGNOSIS — M549 Dorsalgia, unspecified: Secondary | ICD-10-CM

## 2016-01-04 NOTE — Patient Instructions (Signed)
Referral to Neurosurgery Referral to orthopedics for evaluation F/U as previous

## 2016-01-05 NOTE — Assessment & Plan Note (Signed)
Chronic back pain with facet arthritis, slight narrowing of spinal canal at l3-L4 , previous back surgery. He has had multiple epidural injections physical therapy she is on high-dose pain medication she would like to see her previous neurosurgeon again to see if anything else can be done. I think that this is just going to be chronic pain management

## 2016-01-05 NOTE — Progress Notes (Signed)
Patient ID: Erica Herrera, female   DOB: July 01, 1966, 50 y.o.   MRN: [id number]   Subjective:    Patient ID: Erica Herrera, female    DOB: [birth year], 50 y.o.   MRN: [id number]  Patient presents for Pain and Referral Patient here for referrals. Her pain management doctor has closed his practice. She was given prescriptions for the next 3 months therefore she has 2 more months of pain medication on file. She is being treated for chronic neck and back pain. She would actually like to return to her neurosurgeon she had an MRI done in April 2017 which showed progressive facet arthritis as well as a mild disc bulge degenerative changes and mild area of the spinal canal. She continues to have significant back pain. She has had surgery she's had physical therapy she's had epidural injections she is on MS Contin and biologic for breakthrough pain.  The complaints of bilateral hand pain she gets swelling tingling numbness in her fingers. She was seen in the emergency room because of significant pain. She does a lot of repetitive motions with her job. She also gets some locking of her right thumb she had x-rays done which did not show any evidence of any arthritis. She is already on gabapentin and pain medication. She was prescribed prednisone which helped minimally.    Review Of Systems:  GEN- denies fatigue, fever, weight loss,weakness, recent illness HEENT- denies eye drainage, change in vision, nasal discharge, CVS- denies chest pain, palpitations RESP- denies SOB, cough, wheeze ABD- denies N/V, change in stools, abd pain GU- denies dysuria, hematuria, dribbling, incontinence MSK- +joint pain, muscle aches, injury Neuro- denies headache, dizziness, syncope, seizure activity       Objective:    BP 138/74 mmHg  Pulse 82  Temp(Src) 98.7 F (37.1 C) (Oral)  Resp 14  Ht 5\' 7"  (1.702 m)  Wt 187 lb (84.823 kg)  BMI 29.28 kg/m2 GEN- NAD, alert and oriented x3 MSK- bilat hand normal apperance,  some triggering of right thumb with flexion, decreased grasp in right hand compared to left, + phalens and tinels R >l, no swelling, neg finkelsteins Pulse- radial 2+        Assessment & Plan:      Problem List Items Addressed This Visit    Chronic back pain    Chronic back pain with facet arthritis, slight narrowing of spinal canal at l3-L4 , previous back surgery. He has had multiple epidural injections physical therapy she is on high-dose pain medication she would like to see her previous neurosurgeon again to see if anything else can be done. I think that this is just going to be chronic pain management      Relevant Medications   methocarbamol (ROBAXIN) 500 MG tablet   Other Relevant Orders   Ambulatory referral to Neurosurgery    Other Visit Diagnoses    Bilateral carpal tunnel syndrome    -  Primary    Referral to hand surgeon, she is already on Gabapentin, and pain meds, add wrist splints given script    Relevant Medications    methocarbamol (ROBAXIN) 500 MG tablet    gabapentin (NEURONTIN) 800 MG tablet    Other Relevant Orders    Ambulatory referral to Hand Surgery    Trigger finger, acquired        Relevant Orders    Ambulatory referral to Hand Surgery    Facet arthritis of lumbar region        Relevant Medications  methocarbamol (ROBAXIN) 500 MG tablet    Other Relevant Orders    Ambulatory referral to Neurosurgery    DDD (degenerative disc disease), lumbar        Relevant Medications    methocarbamol (ROBAXIN) 500 MG tablet    Other Relevant Orders    Ambulatory referral to Neurosurgery       Note: This dictation was prepared with Dragon dictation along with smaller phrase technology. Any transcriptional errors that result from this process are unintentional.

## 2016-02-03 DIAGNOSIS — M65311 Trigger thumb, right thumb: Secondary | ICD-10-CM | POA: Diagnosis not present

## 2016-02-03 DIAGNOSIS — M65312 Trigger thumb, left thumb: Secondary | ICD-10-CM | POA: Diagnosis not present

## 2016-02-03 DIAGNOSIS — M65331 Trigger finger, right middle finger: Secondary | ICD-10-CM | POA: Diagnosis not present

## 2016-02-03 DIAGNOSIS — G5603 Carpal tunnel syndrome, bilateral upper limbs: Secondary | ICD-10-CM | POA: Diagnosis not present

## 2016-02-17 DIAGNOSIS — M65321 Trigger finger, right index finger: Secondary | ICD-10-CM | POA: Diagnosis not present

## 2016-02-17 DIAGNOSIS — G5603 Carpal tunnel syndrome, bilateral upper limbs: Secondary | ICD-10-CM | POA: Diagnosis not present

## 2016-02-29 DIAGNOSIS — M4806 Spinal stenosis, lumbar region: Secondary | ICD-10-CM | POA: Diagnosis not present

## 2016-03-18 ENCOUNTER — Encounter: Payer: Self-pay | Admitting: Family Medicine

## 2016-03-26 ENCOUNTER — Encounter: Payer: Self-pay | Admitting: Family Medicine

## 2016-03-27 MED ORDER — TIZANIDINE HCL 4 MG PO CAPS: 4.0000 mg | ORAL_CAPSULE | Freq: Three times a day (TID) | ORAL | 0 refills | Status: DC | PRN

## 2016-03-27 MED ORDER — MORPHINE SULFATE ER 15 MG PO TBEA: 1.0000 | EXTENDED_RELEASE_TABLET | Freq: Three times a day (TID) | ORAL | 0 refills | Status: DC | PRN

## 2016-03-27 MED ORDER — GABAPENTIN 800 MG PO TABS: 800.0000 mg | ORAL_TABLET | Freq: Three times a day (TID) | ORAL | 0 refills | Status: DC

## 2016-03-27 MED ORDER — HYDROMORPHONE HCL 4 MG PO TABS: 4.0000 mg | ORAL_TABLET | Freq: Three times a day (TID) | ORAL | 0 refills | Status: DC | PRN

## 2016-04-05 DIAGNOSIS — F319 Bipolar disorder, unspecified: Secondary | ICD-10-CM | POA: Diagnosis not present

## 2016-04-10 ENCOUNTER — Ambulatory Visit (INDEPENDENT_AMBULATORY_CARE_PROVIDER_SITE_OTHER): Payer: Medicare Other | Admitting: Otolaryngology

## 2016-04-10 DIAGNOSIS — G5603 Carpal tunnel syndrome, bilateral upper limbs: Secondary | ICD-10-CM | POA: Diagnosis not present

## 2016-04-10 DIAGNOSIS — Z79899 Other long term (current) drug therapy: Secondary | ICD-10-CM | POA: Diagnosis not present

## 2016-04-10 DIAGNOSIS — M542 Cervicalgia: Secondary | ICD-10-CM | POA: Diagnosis not present

## 2016-04-10 DIAGNOSIS — M5412 Radiculopathy, cervical region: Secondary | ICD-10-CM | POA: Diagnosis not present

## 2016-04-10 DIAGNOSIS — M545 Low back pain: Secondary | ICD-10-CM | POA: Diagnosis not present

## 2016-04-10 DIAGNOSIS — M5416 Radiculopathy, lumbar region: Secondary | ICD-10-CM | POA: Diagnosis not present

## 2016-04-10 DIAGNOSIS — G8929 Other chronic pain: Secondary | ICD-10-CM | POA: Diagnosis not present

## 2016-04-10 DIAGNOSIS — M4696 Unspecified inflammatory spondylopathy, lumbar region: Secondary | ICD-10-CM | POA: Diagnosis not present

## 2016-05-24 DIAGNOSIS — M5412 Radiculopathy, cervical region: Secondary | ICD-10-CM | POA: Diagnosis not present

## 2016-05-24 DIAGNOSIS — M545 Low back pain: Secondary | ICD-10-CM | POA: Diagnosis not present

## 2016-05-24 DIAGNOSIS — Z79891 Long term (current) use of opiate analgesic: Secondary | ICD-10-CM | POA: Diagnosis not present

## 2016-05-24 DIAGNOSIS — M5416 Radiculopathy, lumbar region: Secondary | ICD-10-CM | POA: Diagnosis not present

## 2016-05-24 DIAGNOSIS — G5603 Carpal tunnel syndrome, bilateral upper limbs: Secondary | ICD-10-CM | POA: Diagnosis not present

## 2016-05-24 DIAGNOSIS — M4696 Unspecified inflammatory spondylopathy, lumbar region: Secondary | ICD-10-CM | POA: Diagnosis not present

## 2016-05-24 DIAGNOSIS — M542 Cervicalgia: Secondary | ICD-10-CM | POA: Diagnosis not present

## 2016-07-03 DIAGNOSIS — M5412 Radiculopathy, cervical region: Secondary | ICD-10-CM | POA: Diagnosis not present

## 2016-07-03 DIAGNOSIS — M545 Low back pain: Secondary | ICD-10-CM | POA: Diagnosis not present

## 2016-07-03 DIAGNOSIS — M5416 Radiculopathy, lumbar region: Secondary | ICD-10-CM | POA: Diagnosis not present

## 2016-07-05 DIAGNOSIS — M65311 Trigger thumb, right thumb: Secondary | ICD-10-CM | POA: Diagnosis not present

## 2016-07-05 DIAGNOSIS — M79644 Pain in right finger(s): Secondary | ICD-10-CM | POA: Diagnosis not present

## 2016-07-05 DIAGNOSIS — M654 Radial styloid tenosynovitis [de Quervain]: Secondary | ICD-10-CM | POA: Diagnosis not present

## 2016-07-05 DIAGNOSIS — M25531 Pain in right wrist: Secondary | ICD-10-CM | POA: Diagnosis not present

## 2016-08-01 DIAGNOSIS — M545 Low back pain: Secondary | ICD-10-CM | POA: Diagnosis not present

## 2016-08-01 DIAGNOSIS — M5416 Radiculopathy, lumbar region: Secondary | ICD-10-CM | POA: Diagnosis not present

## 2016-08-01 DIAGNOSIS — R252 Cramp and spasm: Secondary | ICD-10-CM | POA: Diagnosis not present

## 2016-08-01 DIAGNOSIS — M4696 Unspecified inflammatory spondylopathy, lumbar region: Secondary | ICD-10-CM | POA: Diagnosis not present

## 2016-08-01 DIAGNOSIS — M542 Cervicalgia: Secondary | ICD-10-CM | POA: Diagnosis not present

## 2016-08-01 DIAGNOSIS — Z79891 Long term (current) use of opiate analgesic: Secondary | ICD-10-CM | POA: Diagnosis not present

## 2016-08-01 DIAGNOSIS — M5412 Radiculopathy, cervical region: Secondary | ICD-10-CM | POA: Diagnosis not present

## 2016-08-31 DIAGNOSIS — M5416 Radiculopathy, lumbar region: Secondary | ICD-10-CM | POA: Diagnosis not present

## 2016-08-31 DIAGNOSIS — M5412 Radiculopathy, cervical region: Secondary | ICD-10-CM | POA: Diagnosis not present

## 2016-08-31 DIAGNOSIS — Z79891 Long term (current) use of opiate analgesic: Secondary | ICD-10-CM | POA: Diagnosis not present

## 2016-08-31 DIAGNOSIS — M542 Cervicalgia: Secondary | ICD-10-CM | POA: Diagnosis not present

## 2016-08-31 DIAGNOSIS — G43019 Migraine without aura, intractable, without status migrainosus: Secondary | ICD-10-CM | POA: Diagnosis not present

## 2016-08-31 DIAGNOSIS — M545 Low back pain: Secondary | ICD-10-CM | POA: Diagnosis not present

## 2016-09-27 DIAGNOSIS — M4696 Unspecified inflammatory spondylopathy, lumbar region: Secondary | ICD-10-CM | POA: Diagnosis not present

## 2016-09-27 DIAGNOSIS — Z79891 Long term (current) use of opiate analgesic: Secondary | ICD-10-CM | POA: Diagnosis not present

## 2016-09-27 DIAGNOSIS — M545 Low back pain: Secondary | ICD-10-CM | POA: Diagnosis not present

## 2016-09-27 DIAGNOSIS — M5416 Radiculopathy, lumbar region: Secondary | ICD-10-CM | POA: Diagnosis not present

## 2016-09-27 DIAGNOSIS — M461 Sacroiliitis, not elsewhere classified: Secondary | ICD-10-CM | POA: Diagnosis not present

## 2016-09-27 DIAGNOSIS — M5412 Radiculopathy, cervical region: Secondary | ICD-10-CM | POA: Diagnosis not present

## 2016-09-28 ENCOUNTER — Other Ambulatory Visit: Payer: Self-pay | Admitting: Neurology

## 2016-09-28 DIAGNOSIS — M461 Sacroiliitis, not elsewhere classified: Secondary | ICD-10-CM

## 2016-09-28 DIAGNOSIS — M545 Low back pain: Secondary | ICD-10-CM

## 2016-11-08 DIAGNOSIS — M65331 Trigger finger, right middle finger: Secondary | ICD-10-CM | POA: Diagnosis not present

## 2016-11-08 DIAGNOSIS — M79644 Pain in right finger(s): Secondary | ICD-10-CM | POA: Diagnosis not present

## 2016-11-08 DIAGNOSIS — M25531 Pain in right wrist: Secondary | ICD-10-CM | POA: Diagnosis not present

## 2016-11-08 DIAGNOSIS — M654 Radial styloid tenosynovitis [de Quervain]: Secondary | ICD-10-CM | POA: Diagnosis not present

## 2016-11-13 ENCOUNTER — Ambulatory Visit
Admission: RE | Admit: 2016-11-13 | Discharge: 2016-11-13 | Disposition: A | Discharge status: Home / Self Care | Payer: Medicare Other | Source: Ambulatory Visit | Attending: Neurology | Admitting: Neurology

## 2016-11-13 DIAGNOSIS — M461 Sacroiliitis, not elsewhere classified: Secondary | ICD-10-CM

## 2016-11-13 DIAGNOSIS — M545 Low back pain: Secondary | ICD-10-CM

## 2016-11-13 MED ORDER — IOPAMIDOL (ISOVUE-M 200) INJECTION 41%
1.0000 mL | Freq: Once | INTRAMUSCULAR | Status: AC
  Administered 2016-11-13: 1 mL via INTRA_ARTICULAR

## 2016-11-13 MED ORDER — METHYLPREDNISOLONE ACETATE 40 MG/ML INJ SUSP (RADIOLOG
120.0000 mg | Freq: Once | INTRAMUSCULAR | Status: AC
  Administered 2016-11-13: 120 mg via INTRA_ARTICULAR

## 2016-11-13 NOTE — Discharge Instructions (Signed)

## 2016-11-15 ENCOUNTER — Other Ambulatory Visit (HOSPITAL_BASED_OUTPATIENT_CLINIC_OR_DEPARTMENT_OTHER): Payer: Self-pay

## 2016-11-15 DIAGNOSIS — G4700 Insomnia, unspecified: Secondary | ICD-10-CM

## 2016-11-22 DIAGNOSIS — M5416 Radiculopathy, lumbar region: Secondary | ICD-10-CM | POA: Diagnosis not present

## 2016-11-22 DIAGNOSIS — M461 Sacroiliitis, not elsewhere classified: Secondary | ICD-10-CM | POA: Diagnosis not present

## 2016-11-22 DIAGNOSIS — M4696 Unspecified inflammatory spondylopathy, lumbar region: Secondary | ICD-10-CM | POA: Diagnosis not present

## 2016-11-22 DIAGNOSIS — Z79891 Long term (current) use of opiate analgesic: Secondary | ICD-10-CM | POA: Diagnosis not present

## 2016-11-22 DIAGNOSIS — M5412 Radiculopathy, cervical region: Secondary | ICD-10-CM | POA: Diagnosis not present

## 2016-11-27 ENCOUNTER — Ambulatory Visit: Payer: Medicare Other | Attending: Neurology | Admitting: Neurology

## 2016-11-27 ENCOUNTER — Telehealth: Payer: Self-pay | Admitting: *Deleted

## 2016-11-27 DIAGNOSIS — G4733 Obstructive sleep apnea (adult) (pediatric): Secondary | ICD-10-CM | POA: Diagnosis not present

## 2016-11-27 DIAGNOSIS — Z79899 Other long term (current) drug therapy: Secondary | ICD-10-CM | POA: Diagnosis not present

## 2016-11-27 DIAGNOSIS — G4700 Insomnia, unspecified: Secondary | ICD-10-CM | POA: Insufficient documentation

## 2016-11-27 DIAGNOSIS — Z7984 Long term (current) use of oral hypoglycemic drugs: Secondary | ICD-10-CM | POA: Diagnosis not present

## 2016-11-27 DIAGNOSIS — R0683 Snoring: Secondary | ICD-10-CM | POA: Diagnosis not present

## 2016-11-27 NOTE — Telephone Encounter (Signed)
Received VM from patient.   Requested to schedule appointment with MD.

## 2016-11-30 ENCOUNTER — Other Ambulatory Visit: Payer: Self-pay | Admitting: Family Medicine

## 2016-12-01 DIAGNOSIS — R10819 Abdominal tenderness, unspecified site: Secondary | ICD-10-CM | POA: Diagnosis not present

## 2016-12-01 DIAGNOSIS — K5900 Constipation, unspecified: Secondary | ICD-10-CM | POA: Diagnosis not present

## 2016-12-01 DIAGNOSIS — R197 Diarrhea, unspecified: Secondary | ICD-10-CM | POA: Diagnosis not present

## 2016-12-02 NOTE — Procedures (Signed)
[facility] Kameko Hukill A. Gerilyn Pilgrim, MD     [email or web address]             NOCTURNAL POLYSOMNOGRAPHY   LOCATION: [facility]  Patient Name: Erica Herrera, Erica Herrera Date: 11/27/2016 Gender: Female D.O.B: 1965/09/02 Age (years): 2 Referring Provider: Beryle Beams MD, ABSM Height (inches): 67 Interpreting Physician: Beryle Beams MD, ABSM Weight (lbs): 187 RPSGT: Alfonso Ellis BMI: 29 MRN: [id number] Neck Size: 15.00 CLINICAL INFORMATION Sleep Study Type: NPSG  Indication for sleep study: Insomnia  Epworth Sleepiness Score: 7  SLEEP STUDY TECHNIQUE As per the AASM Manual for the Scoring of Sleep and Associated Events v2.3 (April 2016) with a hypopnea requiring 4% desaturations.  The channels recorded and monitored were frontal, central and occipital EEG, electrooculogram (EOG), submentalis EMG (chin), nasal and oral airflow, thoracic and abdominal wall motion, anterior tibialis EMG, snore microphone, electrocardiogram, and pulse oximetry.  MEDICATIONS Medications self-administered by patient taken the night of the study : N/A  Current Outpatient Prescriptions:  .  albuterol (PROVENTIL HFA;VENTOLIN HFA) 108 (90 BASE) MCG/ACT inhaler, Inhale 2 puffs into the lungs every 6 (six) hours as needed for wheezing or shortness of breath., Disp: 1 Inhaler, Rfl: 11 .  atorvastatin (LIPITOR) 80 MG tablet, TAKE ONE TABLET BY MOUTH DAILY AT 6 PM., Disp: 30 tablet, Rfl: 6 .  fluticasone (FLONASE) 50 MCG/ACT nasal spray, Place 2 sprays into both nostrils daily., Disp: 16 g, Rfl: 2 .  gabapentin (NEURONTIN) 800 MG tablet, Take 1 tablet (800 mg total) by mouth 3 (three) times daily., Disp: 90 tablet, Rfl: 0 .  glucose blood test strip, Dispense based on patient and insurance preference. Use to monitor FSBS 2x daily. Dx: E11.65., Disp: 100 each, Rfl: 12 .  HYDROmorphone (DILAUDID) 4 MG tablet, Take 1 tablet (4 mg total) by mouth every 8 (eight) hours as needed for moderate pain., Disp: 90  tablet, Rfl: 0 .  lamoTRIgine (LAMICTAL) 25 MG tablet, Take 1 tablet (25 mg total) by mouth 2 (two) times daily., Disp: 30 tablet, Rfl: 3 .  loratadine (CLARITIN) 10 MG tablet, Take 1 tablet (10 mg total) by mouth daily., Disp: 30 tablet, Rfl: 11 .  metFORMIN (GLUCOPHAGE) 1000 MG tablet, TAKE ONE TABLET BY MOUTH TWICE DAILY WITH A MEAL., Disp: 60 tablet, Rfl: 3 .  Morphine Sulfate ER 15 MG TBEA, Take 1 tablet by mouth every 8 (eight) hours as needed., Disp: 90 tablet, Rfl: 0 .  NITROSTAT 0.4 MG SL tablet, DISSOLVE ONE TABLET UNDER TONGUE EVERY 5 MINUTES UP TO 3 DOSES AS NEEDED FOR CHEST PAIN, Disp: 25 tablet, Rfl: 5 .  sertraline (ZOLOFT) 50 MG tablet, Take 1 tablet (50 mg total) by mouth daily., Disp: 30 tablet, Rfl: 3 .  tiZANidine (ZANAFLEX) 4 MG capsule, Take 1 capsule (4 mg total) by mouth 3 (three) times daily as needed for muscle spasms., Disp: 90 capsule, Rfl: 0   SLEEP ARCHITECTURE The study was initiated at 11:00:53 PM and ended at 5:29:21 AM.  Sleep onset time was 7.8 minutes and the sleep efficiency was 97.1%. The total sleep time was 377.2 minutes.  Stage REM latency was 97.5 minutes.  The patient spent 0.27% of the night in stage N1 sleep, 24.26% in stage N2 sleep, 43.00% in stage N3 and 32.48% in REM.  Alpha intrusion was absent.  Supine sleep was 47.06%.  RESPIRATORY PARAMETERS The overall apnea/hypopnea index (AHI) was 0.8 per hour. There were 2 total apneas, including 1 obstructive, 0 central and 1 mixed apneas. There were  3 hypopneas and 0 RERAs.  The AHI during Stage REM sleep was 2.0 per hour.  AHI while supine was 0.0 per hour.  The mean oxygen saturation was 91.65%. The minimum SpO2 during sleep was 86.00%.  Moderate snoring was noted during this study.  CARDIAC DATA The 2 lead EKG demonstrated sinus rhythm. The mean heart rate was N/A beats per minute. Other EKG findings include: None. LEG MOVEMENT DATA The total PLMS were 0 with a resulting PLMS index of  0.00. Associated arousal with leg movement index was 0.0.  IMPRESSIONS - No significant obstructive sleep apnea occurred during this study. - Clinically significant periodic limb movements did not occur during sleep. No significant associated arousals.   Argie Ramming, MD Diplomate, American Board of Sleep Medicine.  ELECTRONICALLY SIGNED ON:  12/02/2016, 9:36 AM [facility] PH: [phone]   FX: [phone] ACCREDITED BY THE AMERICAN ACADEMY OF SLEEP MEDICINE

## 2016-12-08 ENCOUNTER — Encounter: Payer: Self-pay | Admitting: Family Medicine

## 2016-12-08 ENCOUNTER — Ambulatory Visit: Payer: Medicare Other | Admitting: Family Medicine

## 2016-12-08 ENCOUNTER — Ambulatory Visit (INDEPENDENT_AMBULATORY_CARE_PROVIDER_SITE_OTHER): Payer: Medicare Other | Admitting: Family Medicine

## 2016-12-08 VITALS — BP 142/88 | HR 80 | Temp 98.1°F | Resp 14 | Ht 67.0 in | Wt 174.0 lb

## 2016-12-08 DIAGNOSIS — I10 Essential (primary) hypertension: Secondary | ICD-10-CM | POA: Diagnosis not present

## 2016-12-08 DIAGNOSIS — F3131 Bipolar disorder, current episode depressed, mild: Secondary | ICD-10-CM

## 2016-12-08 DIAGNOSIS — G43809 Other migraine, not intractable, without status migrainosus: Secondary | ICD-10-CM | POA: Diagnosis not present

## 2016-12-08 DIAGNOSIS — E119 Type 2 diabetes mellitus without complications: Secondary | ICD-10-CM | POA: Diagnosis not present

## 2016-12-08 DIAGNOSIS — Z9889 Other specified postprocedural states: Secondary | ICD-10-CM

## 2016-12-08 DIAGNOSIS — E7800 Pure hypercholesterolemia, unspecified: Secondary | ICD-10-CM

## 2016-12-08 DIAGNOSIS — G8929 Other chronic pain: Secondary | ICD-10-CM | POA: Diagnosis not present

## 2016-12-08 DIAGNOSIS — M5441 Lumbago with sciatica, right side: Secondary | ICD-10-CM | POA: Diagnosis not present

## 2016-12-08 DIAGNOSIS — M5442 Lumbago with sciatica, left side: Secondary | ICD-10-CM

## 2016-12-08 MED ORDER — KETOROLAC TROMETHAMINE 60 MG/2ML IM SOLN
60.0000 mg | Freq: Once | INTRAMUSCULAR | Status: AC
  Administered 2016-12-08: 60 mg via INTRAMUSCULAR

## 2016-12-08 MED ORDER — SERTRALINE HCL 100 MG PO TABS: 100.0000 mg | ORAL_TABLET | Freq: Every day | ORAL | 2 refills | Status: DC

## 2016-12-08 MED ORDER — NICOTINE 21 MG/24HR TD PT24: 21.0000 mg | MEDICATED_PATCH | Freq: Every day | TRANSDERMAL | 0 refills | Status: DC

## 2016-12-08 NOTE — Progress Notes (Signed)
Subjective:    Patient ID: Erica Herrera, female    DOB: [birth year], 51 y.o.   MRN: [id number]  Patient presents for Referral (would like referral to Dr. Laurian Brim in [place]) and Migraine  Note she was last seen May 2017 Pt here for pain management. She will like referral to a different pain medicine specialist that she was previously seeing Dr. Laurian Brim until he closed his practice in [place], he is now in [place].  She is on chronic pain medication for chronic back pain, previous back surgery, also has facet arthritis, slighting narrowing of spinal canal. She has had epidural injections and PT. She wants referral back to Dr. Laurian Brim, currently seeing [facility] in [place]. She does not feel like pain doctor is "listening to her"  DM- She is on Metformin, has not followed up for her routine care, last A1C was 7.3% in March 2017,, LDL was 74  , She was on lipitor but she stopped concerned about a side effect, now back on lipitor  did not bring her meter states fasting around 140-150, after meals up to 200's   Feels her appetite is good,   She also has history of HTN, CHF and cardiomyopathy- has not had to use NTG ,   Bipolar disorder- was followed by Dr. Geanie Cooley at [facility], states "because she was on opiods"  Seeing Dr Gerilyn Pilgrim- has been doing Pain management, he recently raised her zoloft to 100mg   Off lamictal for since Sept states she fell into a deep depression and was not taking meds, and then states she was "fired" by her psychiatrist because the NP at neurology refilled her lamictal once    Migraine- recently Started on Belbuca for chronic pain , she had side effects of headache and was not helping with pain, so she stopped the medication and Dilaudid every 8-12 hours as needed #60/month  Still having back pain and on muscle spasm- on zanaflex 4mg  QID       Review Of Systems:  GEN- denies fatigue, fever, +weight loss,weakness, recent  illness HEENT- denies eye drainage, change in vision, nasal discharge, CVS- denies chest pain, palpitations RESP- denies SOB, cough, wheeze ABD- denies N/V, change in stools, abd pain GU- denies dysuria, hematuria, dribbling, incontinence MSK- + joint pain, muscle aches, injury Neuro-+headache, dizziness, syncope, seizure activity       Objective:    BP (!) 142/88   Pulse 80   Temp 98.1 F (36.7 C) (Oral)   Resp 14   Ht 5\' 7"  (1.702 m)   Wt 174 lb (78.9 kg)   SpO2 98%   BMI 27.25 kg/m  GEN- NAD, alert and oriented x3,weight is down 13lbs since her visit In May  HEENT- PERRL, EOMI, non injected sclera, pink conjunctiva, MMM, oropharynx clear Neck- Supple, no thyromegaly CVS- RRR, no murmur RESP-CTAB Psych- normal affect and mood Neuro- CNII-XII intact, no nystagmus no new focal deficits  EXT- No edema Pulses- Radial, DP- 2+        Assessment & Plan:      Problem List Items Addressed This Visit    Hyperlipidemia    Check labs, on statin per report       Relevant Orders   Lipid panel (Completed)   Essential hypertension, benign - Primary    Uncontrolled, but states she is in severe pain Will f/u 6 weeks and recheck       Relevant Orders   CBC with Differential/Platelet (Completed)  Comprehensive metabolic panel (Completed)   TSH (Completed)   DM (diabetes mellitus) (HCC)    Unknown state of control She is prescribed statin Was on ACEI/ARB in past, then she was non compliant with visits, therefore I am not sure when she stopped but needs to resume Check renal function first       Relevant Orders   Hemoglobin A1c (Completed)   Lipid panel (Completed)   Chronic back pain    Will attempt to re-establish with her previous pain doctor, advised her not to STOP her appt with current pain doctor as I will not fill her narcotics She can also call them about her stopping her long actiing, for now she is just using Dilaudid and her muscle relaxer.  Toradol  injection given for her headache      Relevant Medications   ketorolac (TORADOL) injection 60 mg (Completed)   Bipolar disorder (HCC)    Referral to psychiatry  I am concerned with her high doses of pain medications Mental illness Now with weight loss due to "depression" vs metabolic disorder, I dont feel comfortable restarting lamictal she has been off for many months         Other Visit Diagnoses    Other migraine without status migrainosus, not intractable       Relevant Medications   sertraline (ZOLOFT) 100 MG tablet   ketorolac (TORADOL) injection 60 mg (Completed)      Note: This dictation was prepared with Dragon dictation along with smaller phrase technology. Any transcriptional errors that result from this process are unintentional.

## 2016-12-08 NOTE — Patient Instructions (Addendum)
Release of records- Dr. Eduard Clos pain management  We will check labs are prescribed  Referral for mental health  Toradol for headache F/U 6 weeks Recheck blood pressure

## 2016-12-09 LAB — CBC WITH DIFFERENTIAL/PLATELET
Basophils Absolute: 0 cells/uL (ref 0–200)
Basophils Relative: 0 %
EOS ABS: 240 {cells}/uL (ref 15–500)
EOS PCT: 2 %
HCT: 44.4 % (ref 35.0–45.0)
Hemoglobin: 14.9 g/dL (ref 12.0–15.0)
Lymphocytes Relative: 39 %
Lymphs Abs: 4680 cells/uL — ABNORMAL HIGH (ref 850–3900)
MCH: 30.1 pg (ref 27.0–33.0)
MCHC: 33.6 g/dL (ref 32.0–36.0)
MCV: 89.7 fL (ref 80.0–100.0)
MONO ABS: 600 {cells}/uL (ref 200–950)
MPV: 11.4 fL (ref 7.5–12.5)
Monocytes Relative: 5 %
NEUTROS PCT: 54 %
Neutro Abs: 6480 cells/uL (ref 1500–7800)
Platelets: 240 10*3/uL (ref 140–400)
RBC: 4.95 MIL/uL (ref 3.80–5.10)
RDW: 14.6 % (ref 11.0–15.0)
WBC: 12 10*3/uL — ABNORMAL HIGH (ref 3.8–10.8)

## 2016-12-09 LAB — COMPREHENSIVE METABOLIC PANEL
ALBUMIN: 4.3 g/dL (ref 3.6–5.1)
ALT: 19 U/L (ref 6–29)
AST: 17 U/L (ref 10–35)
Alkaline Phosphatase: 103 U/L (ref 33–130)
BUN: 12 mg/dL (ref 7–25)
CO2: 24 mmol/L (ref 20–31)
CREATININE: 0.84 mg/dL (ref 0.50–1.05)
Calcium: 9.9 mg/dL (ref 8.6–10.4)
Chloride: 100 mmol/L (ref 98–110)
GLUCOSE: 183 mg/dL — AB (ref 70–99)
Potassium: 4.3 mmol/L (ref 3.5–5.3)
SODIUM: 136 mmol/L (ref 135–146)
Total Bilirubin: 0.5 mg/dL (ref 0.2–1.2)
Total Protein: 7.5 g/dL (ref 6.1–8.1)

## 2016-12-09 LAB — TSH: TSH: 2.6 mIU/L

## 2016-12-09 LAB — LIPID PANEL
CHOL/HDL RATIO: 4.8 ratio (ref <5.0)
Cholesterol: 198 mg/dL (ref <200)
HDL: 41 mg/dL — ABNORMAL LOW (ref >50)
LDL Cholesterol: 119 mg/dL — ABNORMAL HIGH (ref <100)
Triglycerides: 188 mg/dL — ABNORMAL HIGH (ref <150)
VLDL: 38 mg/dL — AB (ref <30)

## 2016-12-09 LAB — HEMOGLOBIN A1C
Hgb A1c MFr Bld: 8.2 % — ABNORMAL HIGH (ref <5.7)
MEAN PLASMA GLUCOSE: 189 mg/dL

## 2016-12-10 ENCOUNTER — Encounter: Payer: Self-pay | Admitting: Family Medicine

## 2016-12-10 NOTE — Assessment & Plan Note (Signed)
Uncontrolled, but states she is in severe pain Will f/u 6 weeks and recheck

## 2016-12-10 NOTE — Assessment & Plan Note (Signed)
Unknown state of control She is prescribed statin Was on ACEI/ARB in past, then she was non compliant with visits, therefore I am not sure when she stopped but needs to resume Check renal function first

## 2016-12-10 NOTE — Assessment & Plan Note (Signed)
Check labs, on statin per report

## 2016-12-10 NOTE — Assessment & Plan Note (Addendum)
Will attempt to re-establish with her previous pain doctor, advised her not to STOP her appt with current pain doctor as I will not fill her narcotics She can also call them about her stopping her long actiing, for now she is just using Dilaudid and her muscle relaxer.  Toradol injection given for her headache

## 2016-12-10 NOTE — Assessment & Plan Note (Signed)
Referral to psychiatry  I am concerned with her high doses of pain medications Mental illness Now with weight loss due to "depression" vs metabolic disorder, I dont feel comfortable restarting lamictal she has been off for many months

## 2016-12-13 ENCOUNTER — Other Ambulatory Visit: Payer: Self-pay | Admitting: *Deleted

## 2016-12-13 MED ORDER — EMPAGLIFLOZIN 25 MG PO TABS: 25.0000 mg | ORAL_TABLET | Freq: Every day | ORAL | 1 refills | Status: DC

## 2016-12-19 ENCOUNTER — Encounter: Payer: Self-pay | Admitting: Family Medicine

## 2016-12-20 DIAGNOSIS — G43711 Chronic migraine without aura, intractable, with status migrainosus: Secondary | ICD-10-CM | POA: Diagnosis not present

## 2016-12-20 DIAGNOSIS — M4696 Unspecified inflammatory spondylopathy, lumbar region: Secondary | ICD-10-CM | POA: Diagnosis not present

## 2016-12-20 DIAGNOSIS — M5416 Radiculopathy, lumbar region: Secondary | ICD-10-CM | POA: Diagnosis not present

## 2016-12-20 DIAGNOSIS — M461 Sacroiliitis, not elsewhere classified: Secondary | ICD-10-CM | POA: Diagnosis not present

## 2016-12-20 DIAGNOSIS — M5412 Radiculopathy, cervical region: Secondary | ICD-10-CM | POA: Diagnosis not present
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
## 2016-12-26 ENCOUNTER — Ambulatory Visit (INDEPENDENT_AMBULATORY_CARE_PROVIDER_SITE_OTHER): Payer: Medicare Other | Admitting: Psychiatry

## 2016-12-26 ENCOUNTER — Encounter (HOSPITAL_COMMUNITY): Payer: Self-pay | Admitting: Psychiatry

## 2016-12-26 VITALS — BP 148/85 | HR 73 | Ht 67.0 in | Wt 180.0 lb

## 2016-12-26 DIAGNOSIS — Z79899 Other long term (current) drug therapy: Secondary | ICD-10-CM

## 2016-12-26 DIAGNOSIS — I2510 Atherosclerotic heart disease of native coronary artery without angina pectoris: Secondary | ICD-10-CM

## 2016-12-26 DIAGNOSIS — Z818 Family history of other mental and behavioral disorders: Secondary | ICD-10-CM

## 2016-12-26 DIAGNOSIS — F3130 Bipolar disorder, current episode depressed, mild or moderate severity, unspecified: Secondary | ICD-10-CM | POA: Diagnosis not present

## 2016-12-26 DIAGNOSIS — F17210 Nicotine dependence, cigarettes, uncomplicated: Secondary | ICD-10-CM | POA: Diagnosis not present

## 2016-12-26 DIAGNOSIS — I10 Essential (primary) hypertension: Secondary | ICD-10-CM | POA: Diagnosis not present

## 2016-12-26 DIAGNOSIS — Z813 Family history of other psychoactive substance abuse and dependence: Secondary | ICD-10-CM

## 2016-12-26 DIAGNOSIS — F4310 Post-traumatic stress disorder, unspecified: Secondary | ICD-10-CM

## 2016-12-26 DIAGNOSIS — R52 Pain, unspecified: Secondary | ICD-10-CM

## 2016-12-26 DIAGNOSIS — E785 Hyperlipidemia, unspecified: Secondary | ICD-10-CM

## 2016-12-26 DIAGNOSIS — E119 Type 2 diabetes mellitus without complications: Secondary | ICD-10-CM

## 2016-12-26 DIAGNOSIS — Z811 Family history of alcohol abuse and dependence: Secondary | ICD-10-CM | POA: Diagnosis not present

## 2016-12-26 MED ORDER — SERTRALINE HCL 100 MG PO TABS: 100.0000 mg | ORAL_TABLET | Freq: Every day | ORAL | 1 refills | Status: DC

## 2016-12-26 MED ORDER — LAMOTRIGINE 100 MG PO TABS: 50.0000 mg | ORAL_TABLET | Freq: Two times a day (BID) | ORAL | 0 refills | Status: DC

## 2016-12-26 NOTE — Patient Instructions (Signed)
1. Increase lamotrigine 50 mg twice a day  2. Continue sertraline 100 mg daily 3. Contact for therapy: Dr. Daisy Blossom [facility]  [phone] [address] [place], [place] [address] 4. Return to clinic in one month for 30 mins

## 2017-01-09 DIAGNOSIS — Z23 Encounter for immunization: Secondary | ICD-10-CM | POA: Diagnosis not present

## 2017-01-09 DIAGNOSIS — W540XXA Bitten by dog, initial encounter: Secondary | ICD-10-CM | POA: Diagnosis not present

## 2017-01-09 DIAGNOSIS — E119 Type 2 diabetes mellitus without complications: Secondary | ICD-10-CM | POA: Diagnosis not present

## 2017-01-09 DIAGNOSIS — Z7984 Long term (current) use of oral hypoglycemic drugs: Secondary | ICD-10-CM | POA: Diagnosis not present

## 2017-01-09 DIAGNOSIS — I252 Old myocardial infarction: Secondary | ICD-10-CM | POA: Diagnosis not present

## 2017-01-09 DIAGNOSIS — M79644 Pain in right finger(s): Secondary | ICD-10-CM | POA: Diagnosis not present

## 2017-01-09 DIAGNOSIS — Z79899 Other long term (current) drug therapy: Secondary | ICD-10-CM | POA: Diagnosis not present

## 2017-01-09 DIAGNOSIS — S61051A Open bite of right thumb without damage to nail, initial encounter: Secondary | ICD-10-CM | POA: Diagnosis not present
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
## 2017-01-25 ENCOUNTER — Ambulatory Visit (HOSPITAL_COMMUNITY): Payer: Self-pay | Admitting: Psychiatry

## 2017-01-26 ENCOUNTER — Ambulatory Visit: Payer: Medicare Other | Admitting: Family Medicine

## 2017-01-27 ENCOUNTER — Other Ambulatory Visit: Payer: Self-pay | Admitting: Family Medicine

## 2017-01-29 DIAGNOSIS — M47817 Spondylosis without myelopathy or radiculopathy, lumbosacral region: Secondary | ICD-10-CM | POA: Diagnosis not present

## 2017-01-29 DIAGNOSIS — Z79891 Long term (current) use of opiate analgesic: Secondary | ICD-10-CM | POA: Diagnosis not present

## 2017-01-29 DIAGNOSIS — M961 Postlaminectomy syndrome, not elsewhere classified: Secondary | ICD-10-CM | POA: Diagnosis not present

## 2017-01-29 DIAGNOSIS — G8929 Other chronic pain: Secondary | ICD-10-CM | POA: Diagnosis not present
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
## 2017-02-02 ENCOUNTER — Other Ambulatory Visit: Payer: Self-pay | Admitting: *Deleted

## 2017-02-02 MED ORDER — METFORMIN HCL 1000 MG PO TABS: 1000.0000 mg | ORAL_TABLET | Freq: Two times a day (BID) | ORAL | 3 refills | Status: DC

## 2017-02-02 MED ORDER — EMPAGLIFLOZIN 25 MG PO TABS: 25.0000 mg | ORAL_TABLET | Freq: Every day | ORAL | 3 refills | Status: DC

## 2017-02-05 ENCOUNTER — Ambulatory Visit (INDEPENDENT_AMBULATORY_CARE_PROVIDER_SITE_OTHER): Payer: Medicare Other | Admitting: Psychiatry

## 2017-02-05 VITALS — BP 134/78 | HR 98 | Ht 67.0 in | Wt 172.8 lb

## 2017-02-05 DIAGNOSIS — Z810 Family history of intellectual disabilities: Secondary | ICD-10-CM | POA: Diagnosis not present

## 2017-02-05 DIAGNOSIS — F17210 Nicotine dependence, cigarettes, uncomplicated: Secondary | ICD-10-CM | POA: Diagnosis not present

## 2017-02-05 DIAGNOSIS — Z818 Family history of other mental and behavioral disorders: Secondary | ICD-10-CM | POA: Diagnosis not present

## 2017-02-05 DIAGNOSIS — Z813 Family history of other psychoactive substance abuse and dependence: Secondary | ICD-10-CM | POA: Diagnosis not present

## 2017-02-05 DIAGNOSIS — F4310 Post-traumatic stress disorder, unspecified: Secondary | ICD-10-CM

## 2017-02-05 DIAGNOSIS — F3130 Bipolar disorder, current episode depressed, mild or moderate severity, unspecified: Secondary | ICD-10-CM

## 2017-02-05 DIAGNOSIS — Z811 Family history of alcohol abuse and dependence: Secondary | ICD-10-CM

## 2017-02-05 MED ORDER — LAMOTRIGINE 100 MG PO TABS: 50.0000 mg | ORAL_TABLET | Freq: Two times a day (BID) | ORAL | 1 refills | Status: DC

## 2017-02-05 MED ORDER — SERTRALINE HCL 100 MG PO TABS: 100.0000 mg | ORAL_TABLET | Freq: Every day | ORAL | 1 refills | Status: DC

## 2017-02-05 NOTE — Patient Instructions (Signed)
1. Continue lamotrigine 50 mg twice a day  2. Continue sertraline 100 mg daily 3. Referral to therapy 4. Return to clinic in one month for 30 mins

## 2017-02-12 ENCOUNTER — Ambulatory Visit (HOSPITAL_COMMUNITY): Payer: Self-pay | Admitting: Licensed Clinical Social Worker

## 2017-02-14 DIAGNOSIS — M4696 Unspecified inflammatory spondylopathy, lumbar region: Secondary | ICD-10-CM | POA: Diagnosis not present

## 2017-02-14 DIAGNOSIS — M5416 Radiculopathy, lumbar region: Secondary | ICD-10-CM | POA: Diagnosis not present

## 2017-02-14 DIAGNOSIS — Z79891 Long term (current) use of opiate analgesic: Secondary | ICD-10-CM | POA: Diagnosis not present

## 2017-02-14 DIAGNOSIS — M5412 Radiculopathy, cervical region: Secondary | ICD-10-CM | POA: Diagnosis not present

## 2017-02-14 DIAGNOSIS — M545 Low back pain: Secondary | ICD-10-CM | POA: Diagnosis not present

## 2017-02-14 DIAGNOSIS — M461 Sacroiliitis, not elsewhere classified: Secondary | ICD-10-CM | POA: Diagnosis not present

## 2017-02-15 ENCOUNTER — Ambulatory Visit (INDEPENDENT_AMBULATORY_CARE_PROVIDER_SITE_OTHER): Payer: Medicare Other | Admitting: Licensed Clinical Social Worker

## 2017-02-15 ENCOUNTER — Encounter (HOSPITAL_COMMUNITY): Payer: Self-pay | Admitting: Licensed Clinical Social Worker

## 2017-02-15 DIAGNOSIS — Z91410 Personal history of adult physical and sexual abuse: Secondary | ICD-10-CM

## 2017-02-15 DIAGNOSIS — F319 Bipolar disorder, unspecified: Secondary | ICD-10-CM

## 2017-02-15 DIAGNOSIS — Z62810 Personal history of physical and sexual abuse in childhood: Secondary | ICD-10-CM | POA: Diagnosis not present
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
## 2017-02-22 ENCOUNTER — Ambulatory Visit: Payer: Medicare Other | Admitting: Family Medicine

## 2017-02-23 ENCOUNTER — Encounter: Payer: Self-pay | Admitting: Family Medicine

## 2017-03-01 ENCOUNTER — Ambulatory Visit (HOSPITAL_COMMUNITY): Payer: Self-pay | Admitting: Licensed Clinical Social Worker

## 2017-03-06 ENCOUNTER — Ambulatory Visit: Payer: Medicare Other | Admitting: Family Medicine
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
## 2017-03-07 ENCOUNTER — Ambulatory Visit (INDEPENDENT_AMBULATORY_CARE_PROVIDER_SITE_OTHER): Payer: Medicare Other | Admitting: Psychiatry

## 2017-03-07 ENCOUNTER — Encounter (HOSPITAL_COMMUNITY): Payer: Self-pay | Admitting: Psychiatry

## 2017-03-07 VITALS — BP 149/69 | HR 70 | Ht 67.0 in | Wt 173.0 lb

## 2017-03-07 DIAGNOSIS — I10 Essential (primary) hypertension: Secondary | ICD-10-CM

## 2017-03-07 DIAGNOSIS — F17210 Nicotine dependence, cigarettes, uncomplicated: Secondary | ICD-10-CM | POA: Diagnosis not present

## 2017-03-07 DIAGNOSIS — F3130 Bipolar disorder, current episode depressed, mild or moderate severity, unspecified: Secondary | ICD-10-CM

## 2017-03-07 DIAGNOSIS — Z818 Family history of other mental and behavioral disorders: Secondary | ICD-10-CM

## 2017-03-07 DIAGNOSIS — E785 Hyperlipidemia, unspecified: Secondary | ICD-10-CM

## 2017-03-07 DIAGNOSIS — G8929 Other chronic pain: Secondary | ICD-10-CM

## 2017-03-07 DIAGNOSIS — E139 Other specified diabetes mellitus without complications: Secondary | ICD-10-CM | POA: Diagnosis not present

## 2017-03-07 DIAGNOSIS — I2510 Atherosclerotic heart disease of native coronary artery without angina pectoris: Secondary | ICD-10-CM

## 2017-03-07 DIAGNOSIS — Z813 Family history of other psychoactive substance abuse and dependence: Secondary | ICD-10-CM

## 2017-03-07 DIAGNOSIS — Z810 Family history of intellectual disabilities: Secondary | ICD-10-CM | POA: Diagnosis not present

## 2017-03-07 DIAGNOSIS — Z811 Family history of alcohol abuse and dependence: Secondary | ICD-10-CM | POA: Diagnosis not present

## 2017-03-07 MED ORDER — LAMOTRIGINE 100 MG PO TABS: 50.0000 mg | ORAL_TABLET | Freq: Two times a day (BID) | ORAL | 1 refills | Status: DC

## 2017-03-07 MED ORDER — SERTRALINE HCL 100 MG PO TABS: 100.0000 mg | ORAL_TABLET | Freq: Every day | ORAL | 1 refills | Status: DC

## 2017-03-07 NOTE — Patient Instructions (Addendum)
1. Continue lamotrigine 50 mg twice a day 2. Continue sertraline 100 mg daily 3 Return to clinic in one month for 30 mins

## 2017-03-26 DIAGNOSIS — M5416 Radiculopathy, lumbar region: Secondary | ICD-10-CM | POA: Diagnosis not present

## 2017-03-26 DIAGNOSIS — Z79891 Long term (current) use of opiate analgesic: Secondary | ICD-10-CM | POA: Diagnosis not present

## 2017-03-26 DIAGNOSIS — M461 Sacroiliitis, not elsewhere classified: Secondary | ICD-10-CM | POA: Diagnosis not present

## 2017-03-26 DIAGNOSIS — M4696 Unspecified inflammatory spondylopathy, lumbar region: Secondary | ICD-10-CM | POA: Diagnosis not present

## 2017-03-26 DIAGNOSIS — M5412 Radiculopathy, cervical region: Secondary | ICD-10-CM | POA: Diagnosis not present
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
## 2017-04-05 ENCOUNTER — Ambulatory Visit (HOSPITAL_COMMUNITY): Payer: Medicare Other | Admitting: Psychiatry

## 2017-05-23 DIAGNOSIS — M654 Radial styloid tenosynovitis [de Quervain]: Secondary | ICD-10-CM | POA: Diagnosis not present

## 2017-05-23 DIAGNOSIS — M79641 Pain in right hand: Secondary | ICD-10-CM | POA: Diagnosis not present

## 2017-05-23 DIAGNOSIS — M79642 Pain in left hand: Secondary | ICD-10-CM | POA: Diagnosis not present

## 2017-05-23 DIAGNOSIS — M65311 Trigger thumb, right thumb: Secondary | ICD-10-CM | POA: Diagnosis not present

## 2017-05-23 DIAGNOSIS — M65331 Trigger finger, right middle finger: Secondary | ICD-10-CM | POA: Diagnosis not present

## 2017-06-06 DIAGNOSIS — M545 Low back pain: Secondary | ICD-10-CM | POA: Diagnosis not present

## 2017-06-06 DIAGNOSIS — Z79891 Long term (current) use of opiate analgesic: Secondary | ICD-10-CM | POA: Diagnosis not present

## 2017-06-06 DIAGNOSIS — M542 Cervicalgia: Secondary | ICD-10-CM | POA: Diagnosis not present

## 2017-06-06 DIAGNOSIS — G5603 Carpal tunnel syndrome, bilateral upper limbs: Secondary | ICD-10-CM | POA: Diagnosis not present

## 2017-06-14 DIAGNOSIS — E119 Type 2 diabetes mellitus without complications: Secondary | ICD-10-CM | POA: Diagnosis not present

## 2017-06-14 DIAGNOSIS — M654 Radial styloid tenosynovitis [de Quervain]: Secondary | ICD-10-CM | POA: Diagnosis not present

## 2017-06-14 DIAGNOSIS — M65841 Other synovitis and tenosynovitis, right hand: Secondary | ICD-10-CM | POA: Diagnosis not present

## 2017-06-14 DIAGNOSIS — Z806 Family history of leukemia: Secondary | ICD-10-CM | POA: Diagnosis not present

## 2017-06-14 DIAGNOSIS — E7849 Other hyperlipidemia: Secondary | ICD-10-CM | POA: Diagnosis not present

## 2017-06-14 DIAGNOSIS — I10 Essential (primary) hypertension: Secondary | ICD-10-CM | POA: Diagnosis not present

## 2017-06-14 DIAGNOSIS — M65331 Trigger finger, right middle finger: Secondary | ICD-10-CM | POA: Diagnosis not present

## 2017-06-14 DIAGNOSIS — I252 Old myocardial infarction: Secondary | ICD-10-CM | POA: Diagnosis not present

## 2017-06-14 DIAGNOSIS — M65311 Trigger thumb, right thumb: Secondary | ICD-10-CM | POA: Diagnosis not present

## 2017-06-14 DIAGNOSIS — Z9049 Acquired absence of other specified parts of digestive tract: Secondary | ICD-10-CM | POA: Diagnosis not present

## 2017-06-14 DIAGNOSIS — Z809 Family history of malignant neoplasm, unspecified: Secondary | ICD-10-CM | POA: Diagnosis not present

## 2017-06-15 DIAGNOSIS — M65331 Trigger finger, right middle finger: Secondary | ICD-10-CM | POA: Diagnosis not present

## 2017-06-15 DIAGNOSIS — E7849 Other hyperlipidemia: Secondary | ICD-10-CM | POA: Diagnosis not present

## 2017-06-15 DIAGNOSIS — Z809 Family history of malignant neoplasm, unspecified: Secondary | ICD-10-CM | POA: Diagnosis not present

## 2017-06-15 DIAGNOSIS — Z806 Family history of leukemia: Secondary | ICD-10-CM | POA: Diagnosis not present

## 2017-06-15 DIAGNOSIS — M65841 Other synovitis and tenosynovitis, right hand: Secondary | ICD-10-CM | POA: Diagnosis not present

## 2017-06-15 DIAGNOSIS — I10 Essential (primary) hypertension: Secondary | ICD-10-CM | POA: Diagnosis not present

## 2017-06-15 DIAGNOSIS — I252 Old myocardial infarction: Secondary | ICD-10-CM | POA: Diagnosis not present

## 2017-06-15 DIAGNOSIS — E119 Type 2 diabetes mellitus without complications: Secondary | ICD-10-CM | POA: Diagnosis not present

## 2017-06-15 DIAGNOSIS — Z9049 Acquired absence of other specified parts of digestive tract: Secondary | ICD-10-CM | POA: Diagnosis not present

## 2017-06-15 DIAGNOSIS — M65311 Trigger thumb, right thumb: Secondary | ICD-10-CM | POA: Diagnosis not present

## 2017-06-15 DIAGNOSIS — M654 Radial styloid tenosynovitis [de Quervain]: Secondary | ICD-10-CM | POA: Diagnosis not present

## 2017-07-09 ENCOUNTER — Ambulatory Visit: Payer: Self-pay | Admitting: Family Medicine

## 2017-08-01 DIAGNOSIS — M545 Low back pain: Secondary | ICD-10-CM | POA: Diagnosis not present

## 2017-08-01 DIAGNOSIS — M542 Cervicalgia: Secondary | ICD-10-CM | POA: Diagnosis not present

## 2017-08-01 DIAGNOSIS — G894 Chronic pain syndrome: Secondary | ICD-10-CM | POA: Diagnosis not present

## 2017-08-01 DIAGNOSIS — M5416 Radiculopathy, lumbar region: Secondary | ICD-10-CM | POA: Diagnosis not present

## 2017-08-01 DIAGNOSIS — Z79891 Long term (current) use of opiate analgesic: Secondary | ICD-10-CM | POA: Diagnosis not present

## 2017-08-01 DIAGNOSIS — M461 Sacroiliitis, not elsewhere classified: Secondary | ICD-10-CM | POA: Diagnosis not present

## 2017-08-01 DIAGNOSIS — M5412 Radiculopathy, cervical region: Secondary | ICD-10-CM | POA: Diagnosis not present

## 2017-08-02 ENCOUNTER — Other Ambulatory Visit: Payer: Self-pay | Admitting: Neurology

## 2017-08-02 DIAGNOSIS — G459 Transient cerebral ischemic attack, unspecified: Secondary | ICD-10-CM

## 2017-08-18 DIAGNOSIS — J40 Bronchitis, not specified as acute or chronic: Secondary | ICD-10-CM | POA: Diagnosis not present

## 2017-08-18 DIAGNOSIS — J209 Acute bronchitis, unspecified: Secondary | ICD-10-CM | POA: Diagnosis not present

## 2017-08-18 DIAGNOSIS — R51 Headache: Secondary | ICD-10-CM | POA: Diagnosis not present

## 2017-08-18 DIAGNOSIS — Z7984 Long term (current) use of oral hypoglycemic drugs: Secondary | ICD-10-CM | POA: Diagnosis not present

## 2017-08-18 DIAGNOSIS — Z720 Tobacco use: Secondary | ICD-10-CM | POA: Diagnosis not present

## 2017-08-18 DIAGNOSIS — R05 Cough: Secondary | ICD-10-CM | POA: Diagnosis not present

## 2017-08-18 DIAGNOSIS — E119 Type 2 diabetes mellitus without complications: Secondary | ICD-10-CM | POA: Diagnosis not present

## 2017-08-18 DIAGNOSIS — B349 Viral infection, unspecified: Secondary | ICD-10-CM | POA: Diagnosis not present

## 2017-08-18 DIAGNOSIS — K219 Gastro-esophageal reflux disease without esophagitis: Secondary | ICD-10-CM | POA: Diagnosis not present

## 2017-08-18 DIAGNOSIS — Z79899 Other long term (current) drug therapy: Secondary | ICD-10-CM | POA: Diagnosis not present

## 2017-08-18 DIAGNOSIS — M1990 Unspecified osteoarthritis, unspecified site: Secondary | ICD-10-CM | POA: Diagnosis not present

## 2017-09-05 ENCOUNTER — Other Ambulatory Visit: Payer: Self-pay | Admitting: Neurology

## 2017-09-05 DIAGNOSIS — G459 Transient cerebral ischemic attack, unspecified: Secondary | ICD-10-CM

## 2017-09-14 ENCOUNTER — Ambulatory Visit (HOSPITAL_COMMUNITY)
Admission: RE | Admit: 2017-09-14 | Discharge: 2017-09-14 | Disposition: A | Discharge status: Home / Self Care | Payer: Medicare Other | Source: Ambulatory Visit | Attending: Neurology | Admitting: Neurology

## 2017-09-14 DIAGNOSIS — G459 Transient cerebral ischemic attack, unspecified: Secondary | ICD-10-CM

## 2017-10-02 DIAGNOSIS — M5416 Radiculopathy, lumbar region: Secondary | ICD-10-CM | POA: Diagnosis not present

## 2017-10-02 DIAGNOSIS — G894 Chronic pain syndrome: Secondary | ICD-10-CM | POA: Diagnosis not present

## 2017-10-22 DIAGNOSIS — M47816 Spondylosis without myelopathy or radiculopathy, lumbar region: Secondary | ICD-10-CM | POA: Diagnosis not present

## 2017-10-22 DIAGNOSIS — M47812 Spondylosis without myelopathy or radiculopathy, cervical region: Secondary | ICD-10-CM | POA: Diagnosis not present

## 2017-10-22 DIAGNOSIS — M5136 Other intervertebral disc degeneration, lumbar region: Secondary | ICD-10-CM | POA: Diagnosis not present

## 2017-10-22 DIAGNOSIS — Z79891 Long term (current) use of opiate analgesic: Secondary | ICD-10-CM | POA: Diagnosis not present

## 2017-10-22 DIAGNOSIS — M545 Low back pain: Secondary | ICD-10-CM | POA: Diagnosis not present

## 2017-10-29 DIAGNOSIS — G5603 Carpal tunnel syndrome, bilateral upper limbs: Secondary | ICD-10-CM | POA: Diagnosis not present

## 2017-10-29 DIAGNOSIS — Z79891 Long term (current) use of opiate analgesic: Secondary | ICD-10-CM | POA: Diagnosis not present

## 2017-10-29 DIAGNOSIS — G894 Chronic pain syndrome: Secondary | ICD-10-CM | POA: Diagnosis not present

## 2017-10-29 DIAGNOSIS — M5416 Radiculopathy, lumbar region: Secondary | ICD-10-CM | POA: Diagnosis not present

## 2017-10-29 DIAGNOSIS — M461 Sacroiliitis, not elsewhere classified: Secondary | ICD-10-CM | POA: Diagnosis not present

## 2017-10-29 DIAGNOSIS — M5412 Radiculopathy, cervical region: Secondary | ICD-10-CM | POA: Diagnosis not present

## 2017-10-31 DIAGNOSIS — M65352 Trigger finger, left little finger: Secondary | ICD-10-CM | POA: Diagnosis not present

## 2017-10-31 DIAGNOSIS — M65331 Trigger finger, right middle finger: Secondary | ICD-10-CM | POA: Diagnosis not present

## 2017-10-31 DIAGNOSIS — M654 Radial styloid tenosynovitis [de Quervain]: Secondary | ICD-10-CM | POA: Diagnosis not present

## 2017-10-31 DIAGNOSIS — M6530 Trigger finger, unspecified finger: Secondary | ICD-10-CM | POA: Diagnosis not present

## 2017-10-31 DIAGNOSIS — M65311 Trigger thumb, right thumb: Secondary | ICD-10-CM | POA: Diagnosis not present

## 2017-11-05 DIAGNOSIS — M47892 Other spondylosis, cervical region: Secondary | ICD-10-CM | POA: Diagnosis not present

## 2017-11-05 DIAGNOSIS — M47896 Other spondylosis, lumbar region: Secondary | ICD-10-CM | POA: Diagnosis not present

## 2017-11-05 DIAGNOSIS — M47816 Spondylosis without myelopathy or radiculopathy, lumbar region: Secondary | ICD-10-CM | POA: Diagnosis not present

## 2017-11-05 DIAGNOSIS — Z4789 Encounter for other orthopedic aftercare: Secondary | ICD-10-CM | POA: Diagnosis not present

## 2017-11-05 DIAGNOSIS — M50323 Other cervical disc degeneration at C6-C7 level: Secondary | ICD-10-CM | POA: Diagnosis not present

## 2017-11-05 DIAGNOSIS — M47812 Spondylosis without myelopathy or radiculopathy, cervical region: Secondary | ICD-10-CM | POA: Diagnosis not present

## 2017-11-05 DIAGNOSIS — M438X6 Other specified deforming dorsopathies, lumbar region: Secondary | ICD-10-CM | POA: Diagnosis not present

## 2017-11-05 DIAGNOSIS — M5137 Other intervertebral disc degeneration, lumbosacral region: Secondary | ICD-10-CM | POA: Diagnosis not present

## 2017-11-05 DIAGNOSIS — Z981 Arthrodesis status: Secondary | ICD-10-CM | POA: Diagnosis not present

## 2017-11-05 DIAGNOSIS — G894 Chronic pain syndrome: Secondary | ICD-10-CM | POA: Diagnosis not present

## 2017-11-19 DIAGNOSIS — M47816 Spondylosis without myelopathy or radiculopathy, lumbar region: Secondary | ICD-10-CM | POA: Diagnosis not present

## 2017-11-28 DIAGNOSIS — Z7984 Long term (current) use of oral hypoglycemic drugs: Secondary | ICD-10-CM | POA: Diagnosis not present

## 2017-11-28 DIAGNOSIS — M545 Low back pain: Secondary | ICD-10-CM | POA: Diagnosis not present

## 2017-11-28 DIAGNOSIS — Z981 Arthrodesis status: Secondary | ICD-10-CM | POA: Diagnosis not present

## 2017-11-28 DIAGNOSIS — K219 Gastro-esophageal reflux disease without esophagitis: Secondary | ICD-10-CM | POA: Diagnosis not present

## 2017-11-28 DIAGNOSIS — E119 Type 2 diabetes mellitus without complications: Secondary | ICD-10-CM | POA: Diagnosis not present

## 2017-11-28 DIAGNOSIS — Z79899 Other long term (current) drug therapy: Secondary | ICD-10-CM | POA: Diagnosis not present

## 2017-11-28 DIAGNOSIS — R252 Cramp and spasm: Secondary | ICD-10-CM | POA: Diagnosis not present

## 2017-11-28 DIAGNOSIS — M5386 Other specified dorsopathies, lumbar region: Secondary | ICD-10-CM | POA: Diagnosis not present

## 2017-11-28 DIAGNOSIS — I5181 Takotsubo syndrome: Secondary | ICD-10-CM | POA: Diagnosis not present

## 2017-12-04 DIAGNOSIS — Z9049 Acquired absence of other specified parts of digestive tract: Secondary | ICD-10-CM | POA: Diagnosis not present

## 2017-12-04 DIAGNOSIS — A048 Other specified bacterial intestinal infections: Secondary | ICD-10-CM | POA: Diagnosis not present

## 2017-12-04 DIAGNOSIS — E119 Type 2 diabetes mellitus without complications: Secondary | ICD-10-CM | POA: Diagnosis not present

## 2017-12-04 DIAGNOSIS — K219 Gastro-esophageal reflux disease without esophagitis: Secondary | ICD-10-CM | POA: Diagnosis not present

## 2017-12-04 DIAGNOSIS — R1011 Right upper quadrant pain: Secondary | ICD-10-CM | POA: Diagnosis not present

## 2017-12-04 DIAGNOSIS — R109 Unspecified abdominal pain: Secondary | ICD-10-CM | POA: Diagnosis not present

## 2017-12-04 DIAGNOSIS — R1013 Epigastric pain: Secondary | ICD-10-CM | POA: Diagnosis not present

## 2017-12-04 DIAGNOSIS — Z8711 Personal history of peptic ulcer disease: Secondary | ICD-10-CM | POA: Diagnosis not present

## 2017-12-04 DIAGNOSIS — R110 Nausea: Secondary | ICD-10-CM | POA: Diagnosis not present

## 2017-12-04 DIAGNOSIS — B9681 Helicobacter pylori [H. pylori] as the cause of diseases classified elsewhere: Secondary | ICD-10-CM | POA: Diagnosis not present

## 2017-12-04 DIAGNOSIS — Z79899 Other long term (current) drug therapy: Secondary | ICD-10-CM | POA: Diagnosis not present

## 2018-01-09 ENCOUNTER — Telehealth: Payer: Self-pay | Admitting: *Deleted

## 2018-01-09 ENCOUNTER — Other Ambulatory Visit: Payer: Self-pay

## 2018-01-09 ENCOUNTER — Encounter: Payer: Self-pay | Admitting: Family Medicine

## 2018-01-09 ENCOUNTER — Ambulatory Visit (INDEPENDENT_AMBULATORY_CARE_PROVIDER_SITE_OTHER): Payer: Medicare Other | Admitting: Family Medicine

## 2018-01-09 ENCOUNTER — Encounter: Payer: Self-pay | Admitting: *Deleted

## 2018-01-09 VITALS — BP 138/70 | HR 70 | Temp 97.9°F | Resp 14 | Ht 67.0 in | Wt 175.0 lb

## 2018-01-09 DIAGNOSIS — K861 Other chronic pancreatitis: Secondary | ICD-10-CM

## 2018-01-09 DIAGNOSIS — M5442 Lumbago with sciatica, left side: Secondary | ICD-10-CM

## 2018-01-09 DIAGNOSIS — E119 Type 2 diabetes mellitus without complications: Secondary | ICD-10-CM

## 2018-01-09 DIAGNOSIS — M542 Cervicalgia: Secondary | ICD-10-CM

## 2018-01-09 DIAGNOSIS — F4310 Post-traumatic stress disorder, unspecified: Secondary | ICD-10-CM

## 2018-01-09 DIAGNOSIS — F3130 Bipolar disorder, current episode depressed, mild or moderate severity, unspecified: Secondary | ICD-10-CM | POA: Diagnosis not present

## 2018-01-09 DIAGNOSIS — I10 Essential (primary) hypertension: Secondary | ICD-10-CM

## 2018-01-09 DIAGNOSIS — M5441 Lumbago with sciatica, right side: Secondary | ICD-10-CM | POA: Diagnosis not present

## 2018-01-09 DIAGNOSIS — Z1211 Encounter for screening for malignant neoplasm of colon: Secondary | ICD-10-CM | POA: Diagnosis not present

## 2018-01-09 DIAGNOSIS — G8929 Other chronic pain: Secondary | ICD-10-CM

## 2018-01-09 MED ORDER — METFORMIN HCL 1000 MG PO TABS: 1000.0000 mg | ORAL_TABLET | Freq: Two times a day (BID) | ORAL | 2 refills | Status: DC

## 2018-01-09 MED ORDER — LAMOTRIGINE 100 MG PO TABS: 50.0000 mg | ORAL_TABLET | Freq: Two times a day (BID) | ORAL | 1 refills | Status: DC

## 2018-01-09 MED ORDER — ATORVASTATIN CALCIUM 80 MG PO TABS: ORAL_TABLET | ORAL | 2 refills | Status: DC

## 2018-01-09 MED ORDER — SERTRALINE HCL 100 MG PO TABS: 100.0000 mg | ORAL_TABLET | Freq: Every day | ORAL | 2 refills | Status: DC

## 2018-01-09 NOTE — Assessment & Plan Note (Addendum)
Blood pressure looks okay today.  She is not on her statin drug or any of her diabetes mellitus medications.  Discussed the importance of her following up she has not been seen in over a year and  She  Only came in because she wanted the referral to pain clinic and GI.  Did discuss that she may need to find a primary care that is closer to her home if she cannot make it to my office as she has had multiple cancelllations.  Of note also found it interesting that she wanted a note for work stating that she could only work a few days a week because she was in so much pain however has been out of her medication for 2 months and did not seek care for now and no note was given to reduce her work schedule from my office. She then stated she had only worked 4 days in the past 2 months secondary to pain??   Will go ahead and send a referral to a new pain clinic but I would not write any narcotic medications for her and will not write the notation needed for work if nothing is acute she does not have any surgical intervention.  She is also not following with a psychiatrist which she states that because her previous pain physician filled her psych meds she did not follow back up with her psychiatrist.  But then stated that she was working so much she could not go back.  I will refill the Lamictal and Zoloft for couple months until she gets in with a new psychiatrist advised that I will not fill this long-term for her..  I will review the ER records- will send to GI to further evaluate pancreas and for colonoscopy

## 2018-01-09 NOTE — Telephone Encounter (Signed)
Received call from patient.   Reports that she did forget to mention that she is currently on disability and therefore has reduced hours at work. States that she would like note from MD.   MD made aware and advised that note is not appropriate.   Call placed to patient and patient made aware per VM.

## 2018-01-09 NOTE — Patient Instructions (Signed)
Referral to pain clinic  Referral to GI  F/U 3 months

## 2018-01-09 NOTE — Progress Notes (Signed)
Subjective:    Patient ID: Erica Herrera, female    DOB: April 14, 1966, 52 y.o.   MRN: [id number]  Patient presents for ER F/U (was seen at [facility] and dx H Pylori- noted calcification on pancreas from CT)  Pt here to F/U Seen in ER 4/16  for GI upset, told H pylori Given antibiotics and PPI  via blood work and urine??  Had CT scan told calcification on pancreas.   Was suppose have colonoscopy as well, did not follow up with this stomach  Has nausea on occasion. Does have soft BM but no longer diarrhea, has to have BM right after eating  History of cholecystectomy  DM- last A1C 8.2% 1 year ago. Not taking MTF or Jardiance, states Jardiance made her urinate a lot. Not taking her statin drug either.  Pain management- no longer with Dr. Gerilyn Pilgrim , was last on suboxone, had itching all over, she has been on multiple narcotics very high dose, does not want to return to Dr. Gerilyn Pilgrim because she does not want to take the synthetic pain medication Wants referral to new pain clinic Wanted note for work  Also sees Dr. Laurian Brim for injections/ her count was off when she saw Dr. Val Eagle toole's office so he declined prescribing her meds     Reviewed records- Mild calcification suggesting possible chronic pancreatitis on CT      Review Of Systems:  GEN- denies fatigue, fever, weight loss,weakness, recent illness HEENT- denies eye drainage, change in vision, nasal discharge, CVS- denies chest pain, palpitations RESP- denies SOB, cough, wheeze ABD- denies N/V, change in stools, abd pain GU- denies dysuria, hematuria, dribbling, incontinence MSK- +joint pain, muscle aches, injury Neuro- denies headache, dizziness, syncope, seizure activity       Objective:    BP 138/70   Pulse 70   Temp 97.9 F (36.6 C) (Oral)   Resp 14   Ht 5\' 7"  (1.702 m)   Wt 175 lb (79.4 kg)   SpO2 96%   BMI 27.41 kg/m  GEN- NAD, alert and oriented x3 HEENT- PERRL, EOMI, non injected sclera, pink  conjunctiva, MMM, oropharynx clear CVS- RRR, no murmur RESP-CTAB ABD-NABS,soft,mild TTP epigastric region, no rebound, no guarding pSYCH- normal affect and mood EXT- No edema Pulses- Radial, DP- 2+        Assessment & Plan:      Problem List Items Addressed This Visit      Unprioritized   Chronic neck pain   Relevant Orders   Ambulatory referral to Pain Clinic   DM (diabetes mellitus) (HCC)   Relevant Orders   Hemoglobin A1c   Lipid panel   Chronic back pain   Relevant Orders   Ambulatory referral to Pain Clinic   PTSD (post-traumatic stress disorder)   Bipolar I disorder, most recent episode depressed (HCC)   Essential hypertension, benign - Primary    Blood pressure looks okay today.  She is not on her statin drug or any of her diabetes mellitus medications.  Discussed the importance of her following up she has not been seen in over a year and  She  Only came in because she wanted the referral to pain clinic and GI.  Did discuss that she may need to find a primary care that is closer to her home if she cannot make it to my office as she has had multiple cancelllations.  Of note also found it interesting that she wanted a note for work stating that she  could only work a few days a week because she was in so much pain however has been out of her medication for 2 months and did not seek care for now and no note was given to reduce her work schedule from my office. She then stated she had only worked 4 days in the past 2 months secondary to pain??   Will go ahead and send a referral to a new pain clinic but I would not write any narcotic medications for her and will not write the notation needed for work if nothing is acute she does not have any surgical intervention.  She is also not following with a psychiatrist which she states that because her previous pain physician filled her psych meds she did not follow back up with her psychiatrist.  But then stated that she was working so  much she could not go back.  I will refill the Lamictal and Zoloft for couple months until she gets in with a new psychiatrist advised that I will not fill this long-term for her..  I will review the ER records- will send to GI to further evaluate pancreas and for colonoscopy      Relevant Orders   CBC with Differential/Platelet   Comprehensive metabolic panel   Lipid panel      Note: This dictation was prepared with Dragon dictation along with smaller phrase technology. Any transcriptional errors that result from this process are unintentional.

## 2018-01-10 LAB — COMPREHENSIVE METABOLIC PANEL
AG RATIO: 1.5 (calc) (ref 1.0–2.5)
ALKALINE PHOSPHATASE (APISO): 106 U/L (ref 33–130)
ALT: 11 U/L (ref 6–29)
AST: 13 U/L (ref 10–35)
Albumin: 3.6 g/dL (ref 3.6–5.1)
BILIRUBIN TOTAL: 0.3 mg/dL (ref 0.2–1.2)
BUN/Creatinine Ratio: 5 (calc) — ABNORMAL LOW (ref 6–22)
BUN: 5 mg/dL — AB (ref 7–25)
CALCIUM: 9 mg/dL (ref 8.6–10.4)
CO2: 29 mmol/L (ref 20–32)
Chloride: 104 mmol/L (ref 98–110)
Creat: 0.94 mg/dL (ref 0.50–1.05)
Globulin: 2.4 g/dL (calc) (ref 1.9–3.7)
Glucose, Bld: 290 mg/dL — ABNORMAL HIGH (ref 65–99)
POTASSIUM: 4.7 mmol/L (ref 3.5–5.3)
SODIUM: 139 mmol/L (ref 135–146)
Total Protein: 6 g/dL — ABNORMAL LOW (ref 6.1–8.1)

## 2018-01-10 LAB — LIPID PANEL
CHOLESTEROL: 166 mg/dL (ref <200)
HDL: 35 mg/dL — ABNORMAL LOW (ref >50)
LDL CHOLESTEROL (CALC): 94 mg/dL
Non-HDL Cholesterol (Calc): 131 mg/dL (calc) — ABNORMAL HIGH (ref <130)
TRIGLYCERIDES: 257 mg/dL — AB (ref <150)
Total CHOL/HDL Ratio: 4.7 (calc) (ref <5.0)

## 2018-01-10 LAB — CBC WITH DIFFERENTIAL/PLATELET
BASOS ABS: 115 {cells}/uL (ref 0–200)
Basophils Relative: 0.9 %
EOS ABS: 435 {cells}/uL (ref 15–500)
Eosinophils Relative: 3.4 %
HEMATOCRIT: 43.9 % (ref 35.0–45.0)
HEMOGLOBIN: 15.5 g/dL (ref 11.7–15.5)
LYMPHS ABS: 5619 {cells}/uL — AB (ref 850–3900)
MCH: 31.2 pg (ref 27.0–33.0)
MCHC: 35.3 g/dL (ref 32.0–36.0)
MCV: 88.3 fL (ref 80.0–100.0)
MPV: 11.8 fL (ref 7.5–12.5)
Monocytes Relative: 4.1 %
NEUTROS ABS: 6106 {cells}/uL (ref 1500–7800)
NEUTROS PCT: 47.7 %
Platelets: 218 10*3/uL (ref 140–400)
RBC: 4.97 10*6/uL (ref 3.80–5.10)
RDW: 12.7 % (ref 11.0–15.0)
Total Lymphocyte: 43.9 %
WBC: 12.8 10*3/uL — ABNORMAL HIGH (ref 3.8–10.8)
WBCMIX: 525 {cells}/uL (ref 200–950)

## 2018-01-10 LAB — HEMOGLOBIN A1C
Hgb A1c MFr Bld: 11.4 % of total Hgb — ABNORMAL HIGH (ref <5.7)
Mean Plasma Glucose: 280 (calc)
eAG (mmol/L): 15.5 (calc)

## 2018-01-15 ENCOUNTER — Encounter: Payer: Self-pay | Admitting: Internal Medicine

## 2018-01-21 DIAGNOSIS — R0602 Shortness of breath: Secondary | ICD-10-CM | POA: Diagnosis not present

## 2018-01-21 DIAGNOSIS — E559 Vitamin D deficiency, unspecified: Secondary | ICD-10-CM | POA: Diagnosis not present

## 2018-01-21 DIAGNOSIS — E1165 Type 2 diabetes mellitus with hyperglycemia: Secondary | ICD-10-CM | POA: Diagnosis not present

## 2018-01-21 DIAGNOSIS — M129 Arthropathy, unspecified: Secondary | ICD-10-CM | POA: Diagnosis not present

## 2018-01-21 DIAGNOSIS — G8929 Other chronic pain: Secondary | ICD-10-CM | POA: Diagnosis not present

## 2018-01-21 DIAGNOSIS — M5440 Lumbago with sciatica, unspecified side: Secondary | ICD-10-CM | POA: Diagnosis not present

## 2018-01-21 DIAGNOSIS — R300 Dysuria: Secondary | ICD-10-CM | POA: Diagnosis not present

## 2018-01-21 DIAGNOSIS — Z780 Asymptomatic menopausal state: Secondary | ICD-10-CM | POA: Diagnosis not present

## 2018-01-21 DIAGNOSIS — D539 Nutritional anemia, unspecified: Secondary | ICD-10-CM | POA: Diagnosis not present

## 2018-01-21 DIAGNOSIS — M542 Cervicalgia: Secondary | ICD-10-CM | POA: Diagnosis not present

## 2018-01-21 DIAGNOSIS — E7800 Pure hypercholesterolemia, unspecified: Secondary | ICD-10-CM | POA: Diagnosis not present

## 2018-01-21 DIAGNOSIS — R5383 Other fatigue: Secondary | ICD-10-CM | POA: Diagnosis not present

## 2018-01-21 DIAGNOSIS — Z79899 Other long term (current) drug therapy: Secondary | ICD-10-CM | POA: Diagnosis not present

## 2018-01-25 ENCOUNTER — Other Ambulatory Visit: Payer: Self-pay | Admitting: *Deleted

## 2018-01-25 ENCOUNTER — Encounter: Payer: Self-pay | Admitting: *Deleted

## 2018-01-25 MED ORDER — GLIPIZIDE 5 MG PO TABS: 5.0000 mg | ORAL_TABLET | Freq: Two times a day (BID) | ORAL | 3 refills | Status: DC

## 2018-02-15 DIAGNOSIS — M5030 Other cervical disc degeneration, unspecified cervical region: Secondary | ICD-10-CM | POA: Diagnosis not present

## 2018-02-15 DIAGNOSIS — M5136 Other intervertebral disc degeneration, lumbar region: Secondary | ICD-10-CM | POA: Diagnosis not present

## 2018-02-15 DIAGNOSIS — M542 Cervicalgia: Secondary | ICD-10-CM | POA: Diagnosis not present

## 2018-02-15 DIAGNOSIS — G8929 Other chronic pain: Secondary | ICD-10-CM | POA: Diagnosis not present

## 2018-03-15 ENCOUNTER — Other Ambulatory Visit: Payer: Self-pay | Admitting: *Deleted

## 2018-03-15 DIAGNOSIS — M542 Cervicalgia: Secondary | ICD-10-CM | POA: Diagnosis not present

## 2018-03-15 DIAGNOSIS — E1165 Type 2 diabetes mellitus with hyperglycemia: Secondary | ICD-10-CM | POA: Diagnosis not present

## 2018-03-15 DIAGNOSIS — J3089 Other allergic rhinitis: Secondary | ICD-10-CM | POA: Diagnosis not present

## 2018-03-15 DIAGNOSIS — G8929 Other chronic pain: Secondary | ICD-10-CM | POA: Diagnosis not present

## 2018-03-15 DIAGNOSIS — M5030 Other cervical disc degeneration, unspecified cervical region: Secondary | ICD-10-CM | POA: Diagnosis not present

## 2018-03-15 MED ORDER — ALBUTEROL SULFATE HFA 108 (90 BASE) MCG/ACT IN AERS: 2.0000 | INHALATION_SPRAY | Freq: Four times a day (QID) | RESPIRATORY_TRACT | 11 refills | Status: AC | PRN

## 2018-03-15 MED ORDER — LANCETS MISC: 1 refills | Status: DC

## 2018-03-15 MED ORDER — BLOOD GLUCOSE SYSTEM PAK KIT: PACK | 0 refills | Status: DC

## 2018-03-15 MED ORDER — GLUCOSE BLOOD VI STRP: ORAL_STRIP | 12 refills | Status: DC

## 2018-03-19 DIAGNOSIS — M25512 Pain in left shoulder: Secondary | ICD-10-CM | POA: Diagnosis not present

## 2018-03-19 DIAGNOSIS — N3289 Other specified disorders of bladder: Secondary | ICD-10-CM | POA: Diagnosis not present

## 2018-03-19 DIAGNOSIS — R110 Nausea: Secondary | ICD-10-CM | POA: Diagnosis not present

## 2018-03-19 DIAGNOSIS — J45909 Unspecified asthma, uncomplicated: Secondary | ICD-10-CM | POA: Diagnosis not present

## 2018-03-19 DIAGNOSIS — K529 Noninfective gastroenteritis and colitis, unspecified: Secondary | ICD-10-CM | POA: Diagnosis not present

## 2018-03-19 DIAGNOSIS — R197 Diarrhea, unspecified: Secondary | ICD-10-CM | POA: Diagnosis not present

## 2018-03-19 DIAGNOSIS — Z79899 Other long term (current) drug therapy: Secondary | ICD-10-CM | POA: Diagnosis not present

## 2018-03-19 DIAGNOSIS — R112 Nausea with vomiting, unspecified: Secondary | ICD-10-CM | POA: Diagnosis not present

## 2018-03-19 DIAGNOSIS — K219 Gastro-esophageal reflux disease without esophagitis: Secondary | ICD-10-CM | POA: Diagnosis not present

## 2018-03-19 DIAGNOSIS — E119 Type 2 diabetes mellitus without complications: Secondary | ICD-10-CM | POA: Diagnosis not present

## 2018-03-20 DIAGNOSIS — R197 Diarrhea, unspecified: Secondary | ICD-10-CM | POA: Diagnosis not present

## 2018-03-20 DIAGNOSIS — N3289 Other specified disorders of bladder: Secondary | ICD-10-CM | POA: Diagnosis not present

## 2018-04-05 ENCOUNTER — Encounter: Payer: Self-pay | Admitting: Internal Medicine

## 2018-04-05 ENCOUNTER — Telehealth: Payer: Self-pay | Admitting: Gastroenterology

## 2018-04-05 ENCOUNTER — Ambulatory Visit: Payer: Medicare Other | Admitting: Gastroenterology

## 2018-04-05 NOTE — Telephone Encounter (Signed)
Patient was a no show and letter sent  °

## 2018-04-08 ENCOUNTER — Ambulatory Visit: Payer: Medicare Other | Admitting: Family Medicine

## 2018-04-16 ENCOUNTER — Encounter: Payer: Self-pay | Admitting: Family Medicine

## 2018-05-09 DIAGNOSIS — M79606 Pain in leg, unspecified: Secondary | ICD-10-CM | POA: Diagnosis not present

## 2018-05-10 DIAGNOSIS — R7989 Other specified abnormal findings of blood chemistry: Secondary | ICD-10-CM | POA: Diagnosis not present

## 2018-05-30 DIAGNOSIS — Z9049 Acquired absence of other specified parts of digestive tract: Secondary | ICD-10-CM | POA: Diagnosis not present

## 2018-05-30 DIAGNOSIS — Z882 Allergy status to sulfonamides status: Secondary | ICD-10-CM | POA: Diagnosis not present

## 2018-05-30 DIAGNOSIS — Z79899 Other long term (current) drug therapy: Secondary | ICD-10-CM | POA: Diagnosis not present

## 2018-05-30 DIAGNOSIS — K219 Gastro-esophageal reflux disease without esophagitis: Secondary | ICD-10-CM | POA: Diagnosis not present

## 2018-05-30 DIAGNOSIS — Z7984 Long term (current) use of oral hypoglycemic drugs: Secondary | ICD-10-CM | POA: Diagnosis not present

## 2018-05-30 DIAGNOSIS — Z981 Arthrodesis status: Secondary | ICD-10-CM | POA: Diagnosis not present

## 2018-05-30 DIAGNOSIS — M25512 Pain in left shoulder: Secondary | ICD-10-CM | POA: Diagnosis not present

## 2018-05-30 DIAGNOSIS — Z880 Allergy status to penicillin: Secondary | ICD-10-CM | POA: Diagnosis not present

## 2018-05-30 DIAGNOSIS — M75102 Unspecified rotator cuff tear or rupture of left shoulder, not specified as traumatic: Secondary | ICD-10-CM | POA: Diagnosis not present

## 2018-05-30 DIAGNOSIS — M542 Cervicalgia: Secondary | ICD-10-CM | POA: Diagnosis not present

## 2018-05-30 DIAGNOSIS — I5181 Takotsubo syndrome: Secondary | ICD-10-CM | POA: Diagnosis not present

## 2018-05-30 DIAGNOSIS — M5412 Radiculopathy, cervical region: Secondary | ICD-10-CM | POA: Diagnosis not present

## 2018-05-30 DIAGNOSIS — J45909 Unspecified asthma, uncomplicated: Secondary | ICD-10-CM | POA: Diagnosis not present

## 2018-05-30 DIAGNOSIS — E109 Type 1 diabetes mellitus without complications: Secondary | ICD-10-CM | POA: Diagnosis not present

## 2018-06-27 DIAGNOSIS — M25561 Pain in right knee: Secondary | ICD-10-CM | POA: Diagnosis not present

## 2018-06-27 DIAGNOSIS — S76319A Strain of muscle, fascia and tendon of the posterior muscle group at thigh level, unspecified thigh, initial encounter: Secondary | ICD-10-CM | POA: Diagnosis not present

## 2018-07-04 DIAGNOSIS — M25561 Pain in right knee: Secondary | ICD-10-CM | POA: Diagnosis not present

## 2018-07-11 DIAGNOSIS — S93401S Sprain of unspecified ligament of right ankle, sequela: Secondary | ICD-10-CM | POA: Diagnosis not present

## 2018-07-11 DIAGNOSIS — S76319A Strain of muscle, fascia and tendon of the posterior muscle group at thigh level, unspecified thigh, initial encounter: Secondary | ICD-10-CM | POA: Diagnosis not present

## 2018-07-23 DIAGNOSIS — R531 Weakness: Secondary | ICD-10-CM | POA: Diagnosis not present

## 2018-07-23 DIAGNOSIS — R262 Difficulty in walking, not elsewhere classified: Secondary | ICD-10-CM | POA: Diagnosis not present

## 2018-07-23 DIAGNOSIS — S76319A Strain of muscle, fascia and tendon of the posterior muscle group at thigh level, unspecified thigh, initial encounter: Secondary | ICD-10-CM | POA: Diagnosis not present

## 2018-07-23 DIAGNOSIS — S93401S Sprain of unspecified ligament of right ankle, sequela: Secondary | ICD-10-CM | POA: Diagnosis not present

## 2018-07-25 DIAGNOSIS — S93401S Sprain of unspecified ligament of right ankle, sequela: Secondary | ICD-10-CM | POA: Diagnosis not present

## 2018-07-25 DIAGNOSIS — R262 Difficulty in walking, not elsewhere classified: Secondary | ICD-10-CM | POA: Diagnosis not present

## 2018-07-25 DIAGNOSIS — R531 Weakness: Secondary | ICD-10-CM | POA: Diagnosis not present

## 2018-07-25 DIAGNOSIS — S76319A Strain of muscle, fascia and tendon of the posterior muscle group at thigh level, unspecified thigh, initial encounter: Secondary | ICD-10-CM | POA: Diagnosis not present

## 2018-08-09 DIAGNOSIS — S76319A Strain of muscle, fascia and tendon of the posterior muscle group at thigh level, unspecified thigh, initial encounter: Secondary | ICD-10-CM | POA: Diagnosis not present

## 2018-08-09 DIAGNOSIS — M720 Palmar fascial fibromatosis [Dupuytren]: Secondary | ICD-10-CM | POA: Diagnosis not present

## 2018-08-09 DIAGNOSIS — S93401D Sprain of unspecified ligament of right ankle, subsequent encounter: Secondary | ICD-10-CM | POA: Diagnosis not present

## 2018-08-09 DIAGNOSIS — M65352 Trigger finger, left little finger: Secondary | ICD-10-CM | POA: Diagnosis not present

## 2018-08-15 ENCOUNTER — Encounter: Payer: Self-pay | Admitting: Family Medicine

## 2018-08-15 DIAGNOSIS — Z882 Allergy status to sulfonamides status: Secondary | ICD-10-CM | POA: Diagnosis not present

## 2018-08-15 DIAGNOSIS — Z79899 Other long term (current) drug therapy: Secondary | ICD-10-CM | POA: Diagnosis not present

## 2018-08-15 DIAGNOSIS — R29818 Other symptoms and signs involving the nervous system: Secondary | ICD-10-CM | POA: Diagnosis not present

## 2018-08-15 DIAGNOSIS — R4781 Slurred speech: Secondary | ICD-10-CM | POA: Diagnosis not present

## 2018-08-15 DIAGNOSIS — Z720 Tobacco use: Secondary | ICD-10-CM | POA: Diagnosis not present

## 2018-08-15 DIAGNOSIS — R42 Dizziness and giddiness: Secondary | ICD-10-CM | POA: Diagnosis not present

## 2018-08-15 DIAGNOSIS — R918 Other nonspecific abnormal finding of lung field: Secondary | ICD-10-CM | POA: Diagnosis not present

## 2018-08-15 DIAGNOSIS — G43409 Hemiplegic migraine, not intractable, without status migrainosus: Secondary | ICD-10-CM | POA: Diagnosis not present

## 2018-08-15 DIAGNOSIS — Z888 Allergy status to other drugs, medicaments and biological substances status: Secondary | ICD-10-CM | POA: Diagnosis not present

## 2018-08-15 DIAGNOSIS — Z7984 Long term (current) use of oral hypoglycemic drugs: Secondary | ICD-10-CM | POA: Diagnosis not present

## 2018-08-15 DIAGNOSIS — R200 Anesthesia of skin: Secondary | ICD-10-CM | POA: Diagnosis not present

## 2018-08-15 DIAGNOSIS — Z880 Allergy status to penicillin: Secondary | ICD-10-CM | POA: Diagnosis not present

## 2018-08-16 DIAGNOSIS — R202 Paresthesia of skin: Secondary | ICD-10-CM | POA: Diagnosis not present

## 2018-08-16 DIAGNOSIS — R4781 Slurred speech: Secondary | ICD-10-CM | POA: Diagnosis not present

## 2018-08-16 DIAGNOSIS — R42 Dizziness and giddiness: Secondary | ICD-10-CM | POA: Diagnosis not present

## 2018-08-17 ENCOUNTER — Other Ambulatory Visit: Payer: Self-pay | Admitting: Family Medicine

## 2018-08-18 DIAGNOSIS — H6692 Otitis media, unspecified, left ear: Secondary | ICD-10-CM | POA: Diagnosis not present

## 2018-08-18 DIAGNOSIS — Z720 Tobacco use: Secondary | ICD-10-CM | POA: Diagnosis not present

## 2018-08-18 DIAGNOSIS — R05 Cough: Secondary | ICD-10-CM | POA: Diagnosis not present

## 2018-08-23 ENCOUNTER — Ambulatory Visit (INDEPENDENT_AMBULATORY_CARE_PROVIDER_SITE_OTHER): Payer: Medicare Other | Admitting: Family Medicine

## 2018-08-23 ENCOUNTER — Encounter: Payer: Self-pay | Admitting: Family Medicine

## 2018-08-23 VITALS — BP 110/68 | HR 104 | Temp 98.4°F | Resp 15 | Ht 67.0 in | Wt 169.2 lb

## 2018-08-23 DIAGNOSIS — E1165 Type 2 diabetes mellitus with hyperglycemia: Secondary | ICD-10-CM | POA: Diagnosis not present

## 2018-08-23 DIAGNOSIS — J209 Acute bronchitis, unspecified: Secondary | ICD-10-CM | POA: Diagnosis not present

## 2018-08-23 DIAGNOSIS — Z720 Tobacco use: Secondary | ICD-10-CM | POA: Diagnosis not present

## 2018-08-23 MED ORDER — GUAIFENESIN ER 600 MG PO TB12: 600.0000 mg | ORAL_TABLET | Freq: Two times a day (BID) | ORAL | 0 refills | Status: AC

## 2018-08-23 MED ORDER — ALBUTEROL SULFATE (2.5 MG/3ML) 0.083% IN NEBU: 2.5000 mg | INHALATION_SOLUTION | Freq: Four times a day (QID) | RESPIRATORY_TRACT | 1 refills | Status: DC | PRN

## 2018-08-23 MED ORDER — PREDNISONE 20 MG PO TABS
40.0000 mg | ORAL_TABLET | Freq: Every day | ORAL | 0 refills | Status: AC
Start: 2018-08-23 — End: 2018-08-28

## 2018-08-23 MED ORDER — BENZONATATE 100 MG PO CAPS: 100.0000 mg | ORAL_CAPSULE | Freq: Three times a day (TID) | ORAL | 0 refills | Status: DC | PRN

## 2018-08-23 MED ORDER — NEBULIZER/TUBING/MOUTHPIECE KIT: PACK | 0 refills | Status: DC

## 2018-08-23 MED ORDER — AZITHROMYCIN 250 MG PO TABS: ORAL_TABLET | ORAL | 0 refills | Status: DC

## 2018-08-23 MED ORDER — IPRATROPIUM-ALBUTEROL 0.5-2.5 (3) MG/3ML IN SOLN
3.0000 mL | Freq: Once | RESPIRATORY_TRACT | Status: AC
  Administered 2018-08-23: 3 mL via RESPIRATORY_TRACT

## 2018-08-23 NOTE — Patient Instructions (Addendum)
We will call you with your labs - Dr. Jeanice Lim did want you to follow up with her about your other routine medical conditions.  Would want to come fasting to have other fasting labs checked.  Treatment for bronchitis is mucinex, cough meds, steroids, inhalers and sometimes Zpak when you have many other serious health conditions - I have added zpak to treat more aggressively.   Acute Bronchitis, Adult Acute bronchitis is when air tubes (bronchi) in the lungs suddenly get swollen. The condition can make it hard to breathe. It can also cause these symptoms:  A cough.  Coughing up clear, yellow, or green mucus.  Wheezing.  Chest congestion.  Shortness of breath.  A fever.  Body aches.  Chills.  A sore throat. Follow these instructions at home:  Medicines  Take over-the-counter and prescription medicines only as told by your doctor.  If you were prescribed an antibiotic medicine, take it as told by your doctor. Do not stop taking the antibiotic even if you start to feel better. General instructions  Rest.  Drink enough fluids to keep your pee (urine) pale yellow.  Avoid smoking and secondhand smoke. If you smoke and you need help quitting, ask your doctor. Quitting will help your lungs heal faster.  Use an inhaler, cool mist vaporizer, or humidifier as told by your doctor.  Keep all follow-up visits as told by your doctor. This is important. How is this prevented? To lower your risk of getting this condition again:  Wash your hands often with soap and water. If you cannot use soap and water, use hand sanitizer.  Avoid contact with people who have cold symptoms.  Try not to touch your hands to your mouth, nose, or eyes.  Make sure to get the flu shot every year. Contact a doctor if:  Your symptoms do not get better in 2 weeks. Get help right away if:  You cough up blood.  You have chest pain.  You have very bad shortness of breath.  You become  dehydrated.  You faint (pass out) or keep feeling like you are going to pass out.  You keep throwing up (vomiting).  You have a very bad headache.  Your fever or chills gets worse. This information is not intended to replace advice given to you by your health care provider. Make sure you discuss any questions you have with your health care provider. Document Released: 01/24/2008 Document Revised: 03/21/2017 Document Reviewed: 01/26/2016 Elsevier Interactive Patient Education  2019 [employer].

## 2018-08-23 NOTE — Progress Notes (Signed)
Patient ID: Erica Herrera, female    DOB: 06-18-1966, 53 y.o.   MRN: [id number]  PCP: Alycia Rossetti, MD  Chief Complaint  Patient presents with  . Cough    Patient has c/o cough, ear pain, fatigue. Onset 1 month ago.    Subjective:   Erica Herrera is a 53 y.o. female, presents to clinic with CC of 4 months of recurrent/intermittent head and chest colds and with coughs.  She has felt ill for "a long time", she thought she would be better by now, or it would run its course but it keeps coming back, so she states that why she has come today for evaluation.  She has been lost to follow-up for several months for multiple chronic conditions, she reports going to the ER multiple times and talks about multiple conditions, diagnoses and findings that need further work-up however I do not have records available or accessible through care everywhere.  She is concerned about blood work that was done in the ER over this last week.  She states that she had gone in for headaches and migraines about a week ago had some strokelike symptoms had multiple scans of her head and for some reason skin down to her lungs where they found incidental finding of multiple pulmonary nodules per the patient.  She would like further testing on this.  She is a current smoker.  Her complaints today besides from feeling ill fatigue for a long time is that she has productive cough wheeze shortness of breath.  She states that this worsened over the past month although the onset was several months ago.  She has not been able to continue to smoke, she denies any recent fevers but she is currently on antibiotics she is taking Cefdinir for an ear infection which was diagnosed in the ER.  Had blood work done in ER. Had HA/migraine about a week ago with CT and CTa and MRI Ear infection dx this Sunday - pt on Cefdinir 300 mg BID  She reports CXR's done that were negative but reports CT scan "picked up lung nodules"  Smoker - 1/2  ppd, cut back from 1 ppd, total years smoking 40+   Cold and cough Coughing more severe, causing SOB, x6 d, sometimes scantly productive, clear. No pleuritic CP, no fever, sweats, chills A week or so ago she and her daughter were "hot and cold" associated with the URI sx Has been fatigued for weeks  Wt Readings from Last 5 Encounters:  08/23/18 169 lb 4 oz (76.8 kg)  01/09/18 175 lb (79.4 kg)  12/08/16 174 lb (78.9 kg)  01/04/16 187 lb (84.8 kg)  11/15/15 173 lb (78.5 kg)   Blood sugar - just started checking sugars again, she has been noncompliant with medications and follow-up which was scheduled for more than 6 months ago.  She also reports high and low BS readings.  And weight loss. BS yesterday was 409, she took glipizide and rechecked several times and it got below 200 She's had some low BS episodes to 50's She has been peeing a lot - she says no weight loss, but per chart has lost 6 lbs  While doing a breathing treatment her daughter brought in lab results from paperwork she had in the car from 1 of her recent ER visits at [facility]  She is very concerned about some of the lab findings.  Copy has been obtained and will be scanned into the chart  she states she is concerned about the elevated hemoglobin and low sodium.  More concerning and of note is multiple hypoglycemic readings 300s, elevated alk phos, sodium level adjusted for hyperglycemia is normal, kidney function is good with serum creatinine 0.79, hemoglobin and hematocrit is minimally elevated at 15.6/45.9.  Labs are from [facility] dated 08/15/18 and 08/16/2018.  Patient denies any history of lung disease, childhood asthma.  She has not had any PFTs done in the past, she has had inhalers few times in the past for acute bronchitis.  She does have history of heart failure, CAD takotsubo syndrome, cardiomegaly, hyperlipidemia, hypertension and diabetes.  She denies any chest pain or pressure, orthopnea,  PND, palpitations, near-syncope, lower extremity edema.  Patient Active Problem List   Diagnosis Date Noted  . Bipolar I disorder, most recent episode depressed ([place]) 12/26/2016  . PTSD (post-traumatic stress disorder) 12/26/2016  . Essential hypertension, benign 04/21/2014  . Chronic systolic heart failure ([place]) 01/28/2014  . Hx of Inferior STEMI (apical LAD thrombus) vs Tako-Tsubo Syndrome 12/2013 01/03/2014  . Takotsubo syndrome 01/03/2014  . Cardiomyopathy ([place]) 01/02/2014  . Dizzy spells 12/08/2012  . Hematochezia 10/08/2012  . Hyperlipidemia 09/09/2012  . Chronic abdominal pain 09/09/2012  . Shoulder pain 08/01/2012  . Routine general medical examination at a health care facility 01/28/2012  . Bronchitis 11/23/2011  . DM (diabetes mellitus) ([place]) 11/23/2011  . Chronic neck pain 11/23/2011  . Chronic back pain 11/23/2011  . Tobacco user 11/23/2011  . Bipolar disorder ([place]) 11/23/2011     Prior to Admission medications   Medication Sig Start Date End Date Taking? Authorizing Provider  albuterol (PROVENTIL HFA;VENTOLIN HFA) 108 (90 Base) MCG/ACT inhaler Inhale 2 puffs into the lungs every 6 (six) hours as needed for wheezing or shortness of breath. 03/15/18  Yes [place], Modena Nunnery, MD  atorvastatin (LIPITOR) 80 MG tablet TAKE ONE TABLET BY MOUTH DAILY AT 6 PM. 01/09/18  Yes [place], Modena Nunnery, MD  Blood Glucose Monitoring Suppl (BLOOD GLUCOSE SYSTEM PAK) KIT Dispense based on patient and insurance preference. Use to monitor FSBS 2x daily. Dx: E11.65. 03/15/18  Yes [place], Modena Nunnery, MD  gabapentin (NEURONTIN) 800 MG tablet Take 1 tablet (800 mg total) by mouth 3 (three) times daily. 03/27/16  Yes [place], Modena Nunnery, MD  glipiZIDE (GLUCOTROL) 5 MG tablet Take 1 tablet (5 mg total) by mouth 2 (two) times daily before a meal. 01/25/18  Yes [place], Modena Nunnery, MD  glucose blood test strip Dispense based on patient and insurance preference. Use to monitor FSBS 2x daily. Dx: E11.65. 03/15/18  Yes  Alycia Rossetti, MD  lamoTRIgine (LAMICTAL) 100 MG tablet TAKE 1/2 TABLET BY MOUTH TWICE DAILY 08/19/18  Yes [place], Modena Nunnery, MD  Lancets MISC Dispense based on patient and insurance preference. Use to monitor FSBS 2x daily. Dx: E11.65. 03/15/18  Yes Alycia Rossetti, MD  metFORMIN (GLUCOPHAGE) 1000 MG tablet Take 1 tablet (1,000 mg total) by mouth 2 (two) times daily with a meal. 01/09/18  Yes [place], Modena Nunnery, MD  sertraline (ZOLOFT) 100 MG tablet Take 1 tablet (100 mg total) by mouth daily. 01/09/18  Yes [place], Modena Nunnery, MD  tiZANidine (ZANAFLEX) 4 MG capsule Take 1 capsule (4 mg total) by mouth 3 (three) times daily as needed for muscle spasms. Patient taking differently: Take 4 mg by mouth 4 (four) times daily as needed for muscle spasms.  03/27/16  Yes [place], Modena Nunnery, MD  fluticasone [facility]) 50 MCG/ACT  nasal spray Place 2 sprays into both nostrils daily. Patient not taking: Reported on 08/23/2018 11/15/15   Alycia Rossetti, MD  loratadine (CLARITIN) 10 MG tablet Take 1 tablet (10 mg total) by mouth daily. Patient not taking: Reported on 08/23/2018 05/10/15   Alycia Rossetti, MD  nicotine (NICODERM CQ - DOSED IN MG/24 HOURS) 21 mg/24hr patch Place 1 patch (21 mg total) onto the skin daily. Patient not taking: Reported on 01/09/2018 12/08/16   Alycia Rossetti, MD  NITROSTAT 0.4 MG SL tablet DISSOLVE ONE TABLET UNDER TONGUE EVERY 5 MINUTES UP TO 3 DOSES AS NEEDED FOR CHEST PAIN Patient not taking: Reported on 08/23/2018 01/27/15   Burnell Blanks, MD     Allergies  Allergen Reactions  . Imitrex [Sumatriptan Base] Other (See Comments)    hypotension  . Penicillins Shortness Of Breath and Other (See Comments)    Difficulty breathing  . Sulfa Antibiotics Other (See Comments)    Very high fever, muscle spasms  . Adhesive [Tape] Other (See Comments)    unknown     Family History  Problem Relation Age of Onset  . Cancer Mother        kidney   . Alcohol abuse Brother   .  Drug abuse Brother   . Cancer Daughter        Leukemia  . Diabetes Maternal Grandmother   . Alcohol abuse Brother   . Drug abuse Brother   . Mental illness Brother   . Alcohol abuse Brother   . Drug abuse Brother   . Mental illness Brother   . Early death Brother        seizures  . Crohn's disease Paternal Grandmother   . Colon cancer Paternal Grandfather   . Colon cancer Maternal Grandfather      Social History   Socioeconomic History  . Marital status: Married    Spouse name: Not on file  . Number of children: 4  . Years of education: Not on file  . Highest education level: Not on file  Occupational History  . Not on file  Social Needs  . Financial resource strain: Not on file  . Food insecurity:    Worry: Not on file    Inability: Not on file  . Transportation needs:    Medical: Not on file    Non-medical: Not on file  Tobacco Use  . Smoking status: Current Every Day Smoker    Packs/day: 1.00    Years: 36.00    Pack years: 36.00    Types: Cigarettes  . Smokeless tobacco: Never Used  Substance and Sexual Activity  . Alcohol use: Yes    Comment: rare, 12-26-2016 per pt rarely  . Drug use: No    Comment: 12-26-2016 per pt no  . Sexual activity: Not on file  Lifestyle  . Physical activity:    Days per week: Not on file    Minutes per session: Not on file  . Stress: Not on file  Relationships  . Social connections:    Talks on phone: Not on file    Gets together: Not on file    Attends religious service: Not on file    Active member of club or organization: Not on file    Attends meetings of clubs or organizations: Not on file    Relationship status: Not on file  . Intimate partner violence:    Fear of current or ex partner: Not on file    Emotionally  abused: Not on file    Physically abused: Not on file    Forced sexual activity: Not on file  Other Topics Concern  . Not on file  Social History Narrative  . Not on file     Review of Systems    Constitutional: Negative.   HENT: Negative.   Eyes: Negative.   Respiratory: Negative.   Cardiovascular: Negative.   Gastrointestinal: Negative.   Endocrine: Negative.   Genitourinary: Negative.   Musculoskeletal: Negative.   Skin: Negative.   Allergic/Immunologic: Negative.   Neurological: Negative.   Hematological: Negative.   Psychiatric/Behavioral: Negative.   All other systems reviewed and are negative.      Objective:    Vitals:   08/23/18 1406  BP: 110/68  Pulse: (!) 104  Resp: 15  Temp: 98.4 F (36.9 C)  TempSrc: Oral  SpO2: 96%  Weight: 169 lb 4 oz (76.8 kg)  Height: '5\' 7"'$  (1.702 m)      Physical Exam Vitals signs and nursing note reviewed.  Constitutional:      General: She is not in acute distress.    Appearance: Normal appearance. She is well-developed. She is not ill-appearing, toxic-appearing or diaphoretic.     Comments: Chronically ill appearing female  HENT:     Head: Normocephalic and atraumatic.     Right Ear: Hearing, tympanic membrane, ear canal and external ear normal.     Left Ear: Hearing, tympanic membrane, ear canal and external ear normal.     Nose: Rhinorrhea present.     Right Sinus: No maxillary sinus tenderness or frontal sinus tenderness.     Left Sinus: No maxillary sinus tenderness or frontal sinus tenderness.     Mouth/Throat:     Mouth: Mucous membranes are moist. Mucous membranes are not pale.     Pharynx: Oropharynx is clear. Uvula midline. No oropharyngeal exudate or uvula swelling.     Tonsils: No tonsillar abscesses.  Eyes:     General: Lids are normal.        Right eye: No discharge.        Left eye: No discharge.     Conjunctiva/sclera: Conjunctivae normal.     Pupils: Pupils are equal, round, and reactive to light.  Neck:     Musculoskeletal: Normal range of motion and neck supple.     Trachea: Phonation normal. No tracheal deviation.  Cardiovascular:     Rate and Rhythm: Normal rate and regular rhythm.      Pulses: Normal pulses.          Radial pulses are 2+ on the right side and 2+ on the left side.       Posterior tibial pulses are 2+ on the right side and 2+ on the left side.     Heart sounds: Normal heart sounds. No murmur. No friction rub. No gallop.   Pulmonary:     Effort: Pulmonary effort is normal. No respiratory distress.     Breath sounds: No stridor. Wheezing present. No rhonchi or rales.     Comments: Intermittent coughing  Chest:     Chest wall: No tenderness.  Abdominal:     General: Bowel sounds are normal. There is no distension.     Palpations: Abdomen is soft.     Tenderness: There is no abdominal tenderness. There is no guarding or rebound.  Musculoskeletal: Normal range of motion.        General: No deformity.  Lymphadenopathy:     Cervical: No cervical  adenopathy.  Skin:    General: Skin is warm and dry.     Capillary Refill: Capillary refill takes less than 2 seconds.     Coloration: Skin is not pale.     Findings: No rash.  Neurological:     Mental Status: She is alert and oriented to person, place, and time.     Motor: No abnormal muscle tone.     Coordination: Coordination normal.     Gait: Gait normal.  Psychiatric:        Speech: Speech normal.        Behavior: Behavior normal.           Assessment & Plan:      ICD-10-CM   1. Acute bronchitis, unspecified organism J20.9 ipratropium-albuterol (DUONEB) 0.5-2.5 (3) MG/3ML nebulizer solution 3 mL    benzonatate (TESSALON) 100 MG capsule    guaiFENesin (MUCINEX) 600 MG 12 hr tablet    predniSONE (DELTASONE) 20 MG tablet    azithromycin (ZITHROMAX) 250 MG tablet    Respiratory Therapy Supplies (NEBULIZER/TUBING/MOUTHPIECE) KIT    albuterol (PROVENTIL) (2.5 MG/3ML) 0.083% nebulizer solution    Pulmonary function test  2. Uncontrolled type 2 diabetes mellitus with hyperglycemia (HCC) K25.75 COMPLETE METABOLIC PANEL WITH GFR    Hemoglobin A1c  3. Tobacco user Z72.0 Pulmonary function test    Pt  with wheeze on exam, complaint of several months of intermittent head and chest colds, cough worse over the past week, with SOB and fatigue.  Also reports incidental finding with recent ER tests that showed some lung nodules?  Attempting to obtain records - none received yet.  Will address any lung nodules or follow up test   Current smoker, with 40+ pack year hx.  Suspect acute on chronic bronchitis.  She is already being treated for ear infection with cefdinir, lung cleared after duoneb.  Tx with steroids, inhalers, cough meds and supportive measures.    Her PCP is aware that she is here today and that she has not follow up with last recommendations for chronic conditions.  We are obtaining labs for these and she was instructed to f/up with her PCP ASAP.  I do suspect that some of her fatigue and weightloss complaint is secondary to uncontrolled DM.     Delsa Grana, PA-C 08/23/18 2:31 PM

## 2018-08-24 LAB — COMPLETE METABOLIC PANEL WITH GFR
AG RATIO: 1.4 (calc) (ref 1.0–2.5)
ALKALINE PHOSPHATASE (APISO): 118 U/L (ref 33–130)
ALT: 14 U/L (ref 6–29)
AST: 14 U/L (ref 10–35)
Albumin: 4.2 g/dL (ref 3.6–5.1)
BUN: 10 mg/dL (ref 7–25)
CALCIUM: 9.4 mg/dL (ref 8.6–10.4)
CHLORIDE: 98 mmol/L (ref 98–110)
CO2: 25 mmol/L (ref 20–32)
Creat: 0.95 mg/dL (ref 0.50–1.05)
GFR, EST AFRICAN AMERICAN: 79 mL/min/{1.73_m2} (ref >=60)
GFR, Est Non African American: 68 mL/min/{1.73_m2} (ref >=60)
GLOBULIN: 3 g/dL (ref 1.9–3.7)
GLUCOSE: 336 mg/dL — AB (ref 65–99)
POTASSIUM: 4.4 mmol/L (ref 3.5–5.3)
Sodium: 134 mmol/L — ABNORMAL LOW (ref 135–146)
TOTAL PROTEIN: 7.2 g/dL (ref 6.1–8.1)
Total Bilirubin: 0.5 mg/dL (ref 0.2–1.2)

## 2018-08-24 LAB — HEMOGLOBIN A1C
Hgb A1c MFr Bld: 13.4 % of total Hgb — ABNORMAL HIGH (ref <5.7)
Mean Plasma Glucose: 338 (calc)
eAG (mmol/L): 18.7 (calc)

## 2018-08-29 ENCOUNTER — Encounter: Payer: Self-pay | Admitting: Family Medicine

## 2018-08-30 ENCOUNTER — Encounter: Payer: Self-pay | Admitting: Family Medicine

## 2018-09-11 ENCOUNTER — Inpatient Hospital Stay (HOSPITAL_COMMUNITY): Admission: RE | Admit: 2018-09-11 | Payer: Self-pay | Source: Ambulatory Visit

## 2018-09-12 DIAGNOSIS — M720 Palmar fascial fibromatosis [Dupuytren]: Secondary | ICD-10-CM | POA: Diagnosis not present

## 2018-09-12 DIAGNOSIS — M79642 Pain in left hand: Secondary | ICD-10-CM | POA: Diagnosis not present

## 2018-09-12 DIAGNOSIS — R200 Anesthesia of skin: Secondary | ICD-10-CM | POA: Diagnosis not present

## 2018-09-12 DIAGNOSIS — G8929 Other chronic pain: Secondary | ICD-10-CM | POA: Diagnosis not present

## 2018-09-23 ENCOUNTER — Ambulatory Visit: Payer: Medicare Other | Admitting: Family Medicine

## 2018-09-25 ENCOUNTER — Encounter: Payer: Self-pay | Admitting: Family Medicine

## 2018-09-27 ENCOUNTER — Encounter: Payer: Self-pay | Admitting: Family Medicine

## 2018-09-27 ENCOUNTER — Ambulatory Visit (INDEPENDENT_AMBULATORY_CARE_PROVIDER_SITE_OTHER): Payer: Medicare Other | Admitting: Family Medicine

## 2018-09-27 ENCOUNTER — Other Ambulatory Visit: Payer: Self-pay

## 2018-09-27 VITALS — BP 144/68 | HR 87 | Temp 98.5°F | Resp 16 | Ht 67.0 in | Wt 169.0 lb

## 2018-09-27 DIAGNOSIS — I10 Essential (primary) hypertension: Secondary | ICD-10-CM | POA: Diagnosis not present

## 2018-09-27 DIAGNOSIS — I5022 Chronic systolic (congestive) heart failure: Secondary | ICD-10-CM | POA: Diagnosis not present

## 2018-09-27 DIAGNOSIS — E119 Type 2 diabetes mellitus without complications: Secondary | ICD-10-CM

## 2018-09-27 DIAGNOSIS — G8929 Other chronic pain: Secondary | ICD-10-CM

## 2018-09-27 DIAGNOSIS — R1013 Epigastric pain: Secondary | ICD-10-CM

## 2018-09-27 DIAGNOSIS — E7800 Pure hypercholesterolemia, unspecified: Secondary | ICD-10-CM

## 2018-09-27 DIAGNOSIS — F3130 Bipolar disorder, current episode depressed, mild or moderate severity, unspecified: Secondary | ICD-10-CM | POA: Diagnosis not present

## 2018-09-27 DIAGNOSIS — F3131 Bipolar disorder, current episode depressed, mild: Secondary | ICD-10-CM

## 2018-09-27 DIAGNOSIS — M5442 Lumbago with sciatica, left side: Secondary | ICD-10-CM

## 2018-09-27 DIAGNOSIS — M5441 Lumbago with sciatica, right side: Secondary | ICD-10-CM

## 2018-09-27 DIAGNOSIS — R202 Paresthesia of skin: Secondary | ICD-10-CM

## 2018-09-27 LAB — CBC WITH DIFFERENTIAL/PLATELET
Absolute Monocytes: 386 cells/uL (ref 200–950)
Basophils Absolute: 120 cells/uL (ref 0–200)
Basophils Relative: 1.3 %
EOS ABS: 248 {cells}/uL (ref 15–500)
Eosinophils Relative: 2.7 %
HCT: 44.1 % (ref 35.0–45.0)
Hemoglobin: 15.2 g/dL (ref 11.7–15.5)
Lymphs Abs: 4112 cells/uL — ABNORMAL HIGH (ref 850–3900)
MCH: 30.9 pg (ref 27.0–33.0)
MCHC: 34.5 g/dL (ref 32.0–36.0)
MCV: 89.6 fL (ref 80.0–100.0)
MONOS PCT: 4.2 %
MPV: 12.1 fL (ref 7.5–12.5)
Neutro Abs: 4333 cells/uL (ref 1500–7800)
Neutrophils Relative %: 47.1 %
PLATELETS: 232 10*3/uL (ref 140–400)
RBC: 4.92 10*6/uL (ref 3.80–5.10)
RDW: 12.2 % (ref 11.0–15.0)
TOTAL LYMPHOCYTE: 44.7 %
WBC: 9.2 10*3/uL (ref 3.8–10.8)

## 2018-09-27 LAB — LIPID PANEL
CHOL/HDL RATIO: 4.7 (calc) (ref <5.0)
Cholesterol: 189 mg/dL (ref <200)
HDL: 40 mg/dL — ABNORMAL LOW (ref >=50)
LDL Cholesterol (Calc): 109 mg/dL (calc) — ABNORMAL HIGH
Non-HDL Cholesterol (Calc): 149 mg/dL (calc) — ABNORMAL HIGH (ref <130)
Triglycerides: 281 mg/dL — ABNORMAL HIGH (ref <150)

## 2018-09-27 MED ORDER — INSULIN GLARGINE (1 UNIT DIAL) 300 UNIT/ML ~~LOC~~ SOPN: 10.0000 [IU] | PEN_INJECTOR | Freq: Every day | SUBCUTANEOUS | 2 refills | Status: DC

## 2018-09-27 NOTE — Assessment & Plan Note (Signed)
Blood pressure is a little elevated today she has not taken her medications.

## 2018-09-27 NOTE — Assessment & Plan Note (Signed)
Chronic back pain neck pain.  She would like her back to Dr. Gerilyn Pilgrim who was her previous pain management physician

## 2018-09-27 NOTE — Patient Instructions (Addendum)
EMG to be done  Referral back to pain clinic- Dr Gerilyn Pilgrim  Start Tujeo 10 units at bedtime Check CBG fasting and before bedtime and record Release of records- [facility], all ER notes, images for 2019 We will call with lab results  FLONASE or nasacort  F/U 4 weeks with readings

## 2018-09-27 NOTE — Progress Notes (Signed)
Subjective:    Patient ID: Erica Herrera, female    DOB: 02-Mar-1966, 53 y.o.   MRN: [id number]  Patient presents for Follow-up (is fasting)  Pt here to f/u chronic medical problems  seen for sick visit in Jan Has not followed up for her diabetes since May  Were obtained at that visit her A1C 13.4/ glucose 336 She stopped side a week ago as she states it was causing her to have diarrhea.  She denies that it was the metformin.  She has been in and out of [facility] ER multiple times over the past few months for various reasons.  She was told that she had pancreatic lesion in the head of her pancreas that she shows signs of chronic pancreatitis was also told that she had H. pylori in her blood.  She was seen recently for complex migraine other they did do stroke work-up which was negative.   Duptyrens contractures and Chronic tendinitis- needs EMG , using brace , her insurance will not cover volatren gel  ReViewed orthopedic note recommending the nerve conduction     Chronic bronchitis- had abnormal CXR and CT of chest she was treated for bronchitis at her last visit in January.  She was also set up for pulmonary function test which she had to reschedule until the 12th of this month.  She continues to have pain all over her back her neck her hands.  States that the last pain physician would not prescribe her anything she would like to return back to her previous pain doctor that she had left because she was being given synthetics   Review Of Systems:  GEN- denies fatigue, fever, weight loss,weakness, recent illness HEENT- denies eye drainage, change in vision, nasal discharge, CVS- denies chest pain, palpitations RESP- denies SOB, cough, wheeze ABD- denies N/V, change in stools, abd pain GU- denies dysuria, hematuria, dribbling, incontinence MSK- + joint pain, muscle aches, injury Neuro- denies headache, dizziness, syncope, seizure activity       Objective:    BP (!) 144/68    Pulse 87   Temp 98.5 F (36.9 C) (Oral)   Resp 16   Ht 5\' 7"  (1.702 m)   Wt 169 lb (76.7 kg)   SpO2 95%   BMI 26.47 kg/m  GEN- NAD, alert and oriented x3, has strong odor to clothing  HEENT- PERRL, EOMI, non injected sclera, pink conjunctiva, MMM, oropharynx clear Neck- Supple, no thyromegaly, no JVD CVS- RRR, no murmur RESP-CTAB ABD-NABS,soft,ND, mild TTQ RUQ, no rebound, no guarding, no CVA tenderness  EXT- No edema Pulses- Radial  2+        Assessment & Plan:      Problem List Items Addressed This Visit      Unprioritized   Bipolar disorder (HCC)    Continued on lamictal, zoloft      RESOLVED: Bipolar I disorder, most recent episode depressed (HCC)   Chronic back pain    Chronic back pain neck pain.  She would like her back to Dr. Gerilyn Pilgrim who was her previous pain management physician      Relevant Orders   Ambulatory referral to Pain Clinic   Chronic systolic heart failure (HCC) - Primary    No sign of decompensation or fluid overload on exam.  She has been in the ER multiple times over at [facility] on getting obtain all the records from 2019 and review her CT scans to see if she does need further follow-up  with specialist.  Discussed the importance for following up with her routine visits which I have already talked with her before about this.      DM (diabetes mellitus) (HCC)    Significantly uncontrolled diabetes in the setting of her weight loss.  We will start her on Toujeo 10 units at bedtime she will continue the metformin she is stopped the glipizide she feels this gives her diarrhea and is adamant it is not the metformin. CBG twice a day follow-up 4 weeks      Relevant Medications   Insulin Glargine, 1 Unit Dial, (TOUJEO SOLOSTAR) 300 UNIT/ML SOPN   Essential hypertension, benign    Blood pressure is a little elevated today she has not taken her medications.      Relevant Orders   Lipid panel   CBC with Differential/Platelet    Hyperlipidemia   Relevant Orders   Lipid panel    Other Visit Diagnoses    Epigastric pain       Pain records per above.  H. pylori breath test done.  Still positive refer to GI for endoscopy   Relevant Orders   H. pylori breath test   Paresthesia of both hands       Nerve conduction study to be done.   Relevant Orders   Nerve conduction test      Note: This dictation was prepared with Dragon dictation along with smaller phrase technology. Any transcriptional errors that result from this process are unintentional.

## 2018-09-27 NOTE — Assessment & Plan Note (Signed)
No sign of decompensation or fluid overload on exam.  She has been in the ER multiple times over at [facility] on getting obtain all the records from 2019 and review her CT scans to see if she does need further follow-up with specialist.  Discussed the importance for following up with her routine visits which I have already talked with her before about this.

## 2018-09-27 NOTE — Assessment & Plan Note (Signed)
Continued on lamictal, zoloft

## 2018-09-27 NOTE — Assessment & Plan Note (Addendum)
Significantly uncontrolled diabetes in the setting of her weight loss.  We will start her on Toujeo 10 units at bedtime she will continue the metformin she is stopped the glipizide she feels this gives her diarrhea and is adamant it is not the metformin. CBG twice a day follow-up 4 weeks

## 2018-09-28 ENCOUNTER — Encounter: Payer: Self-pay | Admitting: Family Medicine

## 2018-09-30 ENCOUNTER — Other Ambulatory Visit: Payer: Self-pay | Admitting: Family Medicine

## 2018-09-30 DIAGNOSIS — Z720 Tobacco use: Secondary | ICD-10-CM

## 2018-09-30 DIAGNOSIS — R911 Solitary pulmonary nodule: Secondary | ICD-10-CM

## 2018-09-30 DIAGNOSIS — R918 Other nonspecific abnormal finding of lung field: Secondary | ICD-10-CM

## 2018-09-30 LAB — H. PYLORI BREATH TEST: H. pylori Breath Test: NOT DETECTED

## 2018-10-01 ENCOUNTER — Other Ambulatory Visit: Payer: Self-pay | Admitting: *Deleted

## 2018-10-01 MED ORDER — INSULIN PEN NEEDLE 32G X 6 MM MISC: 1 refills | Status: DC

## 2018-10-02 ENCOUNTER — Encounter (HOSPITAL_COMMUNITY): Payer: Self-pay

## 2018-10-02 ENCOUNTER — Ambulatory Visit (HOSPITAL_COMMUNITY): Admission: RE | Admit: 2018-10-02 | Payer: Medicare Other | Source: Ambulatory Visit

## 2018-10-03 NOTE — Progress Notes (Signed)
Left message return call

## 2018-10-04 ENCOUNTER — Telehealth: Payer: Self-pay | Admitting: Family Medicine

## 2018-10-04 ENCOUNTER — Encounter: Payer: Self-pay | Admitting: Family Medicine

## 2018-10-04 DIAGNOSIS — I700 Atherosclerosis of aorta: Secondary | ICD-10-CM | POA: Insufficient documentation

## 2018-10-04 DIAGNOSIS — R1013 Epigastric pain: Secondary | ICD-10-CM

## 2018-10-04 DIAGNOSIS — K861 Other chronic pancreatitis: Secondary | ICD-10-CM

## 2018-10-04 NOTE — Telephone Encounter (Signed)
Reviewed notes from [facility] ([place] pt needs referral to GI For chronic pancreatitis, Epigastric pain

## 2018-10-07 ENCOUNTER — Encounter: Payer: Self-pay | Admitting: Family Medicine

## 2018-10-07 MED ORDER — ROSUVASTATIN CALCIUM 10 MG PO TABS: 10.0000 mg | ORAL_TABLET | Freq: Every day | ORAL | 1 refills | Status: DC

## 2018-10-07 NOTE — Progress Notes (Signed)
Left message return call

## 2018-10-07 NOTE — Progress Notes (Signed)
Letter mailed to patient to notify her that we have been trying to reach her

## 2018-10-07 NOTE — Progress Notes (Signed)
Patient was notified of CT by Curtis Sites when she called patient with appointment and patient verbalized understanding.

## 2018-10-10 ENCOUNTER — Encounter: Payer: Self-pay | Admitting: Internal Medicine

## 2018-10-14 ENCOUNTER — Ambulatory Visit (HOSPITAL_COMMUNITY)
Admission: RE | Admit: 2018-10-14 | Discharge: 2018-10-14 | Disposition: A | Discharge status: Home / Self Care | Payer: Medicare Other | Source: Ambulatory Visit | Attending: Family Medicine | Admitting: Family Medicine

## 2018-10-14 DIAGNOSIS — R911 Solitary pulmonary nodule: Secondary | ICD-10-CM | POA: Diagnosis not present

## 2018-10-14 DIAGNOSIS — R918 Other nonspecific abnormal finding of lung field: Secondary | ICD-10-CM | POA: Diagnosis not present

## 2018-10-15 DIAGNOSIS — R509 Fever, unspecified: Secondary | ICD-10-CM | POA: Diagnosis not present

## 2018-10-15 DIAGNOSIS — Z79899 Other long term (current) drug therapy: Secondary | ICD-10-CM | POA: Diagnosis not present

## 2018-10-15 DIAGNOSIS — R062 Wheezing: Secondary | ICD-10-CM | POA: Diagnosis not present

## 2018-10-15 DIAGNOSIS — I5181 Takotsubo syndrome: Secondary | ICD-10-CM | POA: Diagnosis not present

## 2018-10-15 DIAGNOSIS — Z8619 Personal history of other infectious and parasitic diseases: Secondary | ICD-10-CM | POA: Diagnosis not present

## 2018-10-15 DIAGNOSIS — Z7984 Long term (current) use of oral hypoglycemic drugs: Secondary | ICD-10-CM | POA: Diagnosis not present

## 2018-10-15 DIAGNOSIS — E119 Type 2 diabetes mellitus without complications: Secondary | ICD-10-CM | POA: Diagnosis not present

## 2018-10-15 DIAGNOSIS — J45909 Unspecified asthma, uncomplicated: Secondary | ICD-10-CM | POA: Diagnosis not present

## 2018-10-15 DIAGNOSIS — Z720 Tobacco use: Secondary | ICD-10-CM | POA: Diagnosis not present

## 2018-10-15 NOTE — Addendum Note (Signed)
Addended by: Danelle Berry on: 10/15/2018 01:34 PM   Modules accepted: Orders

## 2018-10-18 ENCOUNTER — Other Ambulatory Visit: Payer: Self-pay | Admitting: Family Medicine

## 2018-10-25 ENCOUNTER — Ambulatory Visit: Payer: Medicare Other | Admitting: Family Medicine

## 2018-11-03 DIAGNOSIS — R1013 Epigastric pain: Secondary | ICD-10-CM | POA: Diagnosis not present

## 2018-11-03 DIAGNOSIS — R1011 Right upper quadrant pain: Secondary | ICD-10-CM | POA: Diagnosis not present

## 2018-11-03 DIAGNOSIS — Z79899 Other long term (current) drug therapy: Secondary | ICD-10-CM | POA: Diagnosis not present

## 2018-11-03 DIAGNOSIS — Z7984 Long term (current) use of oral hypoglycemic drugs: Secondary | ICD-10-CM | POA: Diagnosis not present

## 2018-11-03 DIAGNOSIS — K861 Other chronic pancreatitis: Secondary | ICD-10-CM | POA: Diagnosis not present

## 2018-11-08 ENCOUNTER — Ambulatory Visit: Payer: Medicare Other | Admitting: Gastroenterology

## 2018-11-18 ENCOUNTER — Encounter: Payer: Medicare Other | Admitting: Family Medicine

## 2018-11-27 DIAGNOSIS — R109 Unspecified abdominal pain: Secondary | ICD-10-CM | POA: Diagnosis not present

## 2018-11-27 DIAGNOSIS — E1165 Type 2 diabetes mellitus with hyperglycemia: Secondary | ICD-10-CM | POA: Diagnosis not present

## 2018-11-27 DIAGNOSIS — H269 Unspecified cataract: Secondary | ICD-10-CM | POA: Diagnosis not present

## 2018-11-27 DIAGNOSIS — J45909 Unspecified asthma, uncomplicated: Secondary | ICD-10-CM | POA: Diagnosis not present

## 2018-11-27 DIAGNOSIS — E860 Dehydration: Secondary | ICD-10-CM | POA: Diagnosis not present

## 2018-11-27 DIAGNOSIS — R112 Nausea with vomiting, unspecified: Secondary | ICD-10-CM | POA: Diagnosis not present

## 2018-11-27 DIAGNOSIS — E785 Hyperlipidemia, unspecified: Secondary | ICD-10-CM | POA: Diagnosis not present

## 2018-11-27 DIAGNOSIS — H409 Unspecified glaucoma: Secondary | ICD-10-CM | POA: Diagnosis not present

## 2018-11-27 DIAGNOSIS — Z794 Long term (current) use of insulin: Secondary | ICD-10-CM | POA: Diagnosis not present

## 2018-11-27 DIAGNOSIS — I10 Essential (primary) hypertension: Secondary | ICD-10-CM | POA: Diagnosis not present

## 2018-11-27 DIAGNOSIS — Z79899 Other long term (current) drug therapy: Secondary | ICD-10-CM | POA: Diagnosis not present

## 2018-11-27 DIAGNOSIS — K861 Other chronic pancreatitis: Secondary | ICD-10-CM | POA: Diagnosis not present

## 2018-11-27 DIAGNOSIS — K859 Acute pancreatitis without necrosis or infection, unspecified: Secondary | ICD-10-CM | POA: Diagnosis not present

## 2018-11-27 DIAGNOSIS — Z9049 Acquired absence of other specified parts of digestive tract: Secondary | ICD-10-CM | POA: Diagnosis not present

## 2018-11-27 DIAGNOSIS — K858 Other acute pancreatitis without necrosis or infection: Secondary | ICD-10-CM | POA: Diagnosis not present

## 2018-11-27 DIAGNOSIS — E1140 Type 2 diabetes mellitus with diabetic neuropathy, unspecified: Secondary | ICD-10-CM | POA: Diagnosis not present

## 2018-11-28 ENCOUNTER — Other Ambulatory Visit: Payer: Self-pay | Admitting: Family Medicine

## 2018-11-28 DIAGNOSIS — K861 Other chronic pancreatitis: Secondary | ICD-10-CM | POA: Diagnosis not present

## 2018-11-28 DIAGNOSIS — E1165 Type 2 diabetes mellitus with hyperglycemia: Secondary | ICD-10-CM | POA: Diagnosis not present

## 2018-11-28 DIAGNOSIS — E1140 Type 2 diabetes mellitus with diabetic neuropathy, unspecified: Secondary | ICD-10-CM | POA: Diagnosis not present

## 2018-11-28 DIAGNOSIS — Z794 Long term (current) use of insulin: Secondary | ICD-10-CM | POA: Diagnosis not present

## 2018-11-28 DIAGNOSIS — K859 Acute pancreatitis without necrosis or infection, unspecified: Secondary | ICD-10-CM | POA: Diagnosis not present

## 2018-11-28 DIAGNOSIS — E785 Hyperlipidemia, unspecified: Secondary | ICD-10-CM | POA: Diagnosis not present

## 2018-11-28 DIAGNOSIS — H409 Unspecified glaucoma: Secondary | ICD-10-CM | POA: Diagnosis not present

## 2018-11-28 DIAGNOSIS — J45909 Unspecified asthma, uncomplicated: Secondary | ICD-10-CM | POA: Diagnosis not present

## 2018-11-28 DIAGNOSIS — I10 Essential (primary) hypertension: Secondary | ICD-10-CM | POA: Diagnosis not present

## 2018-11-28 DIAGNOSIS — H269 Unspecified cataract: Secondary | ICD-10-CM | POA: Diagnosis not present

## 2018-11-28 DIAGNOSIS — Z9049 Acquired absence of other specified parts of digestive tract: Secondary | ICD-10-CM | POA: Diagnosis not present

## 2018-11-28 DIAGNOSIS — Z79899 Other long term (current) drug therapy: Secondary | ICD-10-CM | POA: Diagnosis not present

## 2018-11-29 ENCOUNTER — Telehealth: Payer: Self-pay | Admitting: Family Medicine

## 2018-11-29 MED ORDER — PROMETHAZINE HCL 25 MG PO TABS: 25.0000 mg | ORAL_TABLET | Freq: Four times a day (QID) | ORAL | 1 refills | Status: DC | PRN

## 2018-11-29 NOTE — Telephone Encounter (Signed)
Pt called after hours line, states she was discharged from hospital due to pancreatitis and hyperglycemia but she was not given any pain medication or nausea medication. Note she has missed multiple appt at our office I have no records of the hospitalization Advised we do not call in narcotics after hours, she needs to call hospital back and ask for her discharge prescriptions I will go ahead and Rx the phenergan which she has had before

## 2018-12-19 DIAGNOSIS — R079 Chest pain, unspecified: Secondary | ICD-10-CM | POA: Diagnosis not present

## 2018-12-19 DIAGNOSIS — Z720 Tobacco use: Secondary | ICD-10-CM | POA: Diagnosis not present

## 2018-12-19 DIAGNOSIS — Z7984 Long term (current) use of oral hypoglycemic drugs: Secondary | ICD-10-CM | POA: Diagnosis not present

## 2018-12-19 DIAGNOSIS — Z882 Allergy status to sulfonamides status: Secondary | ICD-10-CM | POA: Diagnosis not present

## 2018-12-19 DIAGNOSIS — Z880 Allergy status to penicillin: Secondary | ICD-10-CM | POA: Diagnosis not present

## 2018-12-19 DIAGNOSIS — Z79899 Other long term (current) drug therapy: Secondary | ICD-10-CM | POA: Diagnosis not present

## 2018-12-19 DIAGNOSIS — K861 Other chronic pancreatitis: Secondary | ICD-10-CM | POA: Diagnosis not present

## 2018-12-19 DIAGNOSIS — R110 Nausea: Secondary | ICD-10-CM | POA: Diagnosis not present

## 2018-12-19 DIAGNOSIS — K219 Gastro-esophageal reflux disease without esophagitis: Secondary | ICD-10-CM | POA: Diagnosis not present

## 2018-12-19 DIAGNOSIS — M79602 Pain in left arm: Secondary | ICD-10-CM | POA: Diagnosis not present

## 2018-12-19 DIAGNOSIS — E119 Type 2 diabetes mellitus without complications: Secondary | ICD-10-CM | POA: Diagnosis not present

## 2018-12-19 DIAGNOSIS — M79601 Pain in right arm: Secondary | ICD-10-CM | POA: Diagnosis not present

## 2018-12-19 DIAGNOSIS — R0989 Other specified symptoms and signs involving the circulatory and respiratory systems: Secondary | ICD-10-CM | POA: Diagnosis not present

## 2018-12-19 DIAGNOSIS — Z888 Allergy status to other drugs, medicaments and biological substances status: Secondary | ICD-10-CM | POA: Diagnosis not present

## 2018-12-19 DIAGNOSIS — Z8679 Personal history of other diseases of the circulatory system: Secondary | ICD-10-CM | POA: Diagnosis not present

## 2018-12-27 ENCOUNTER — Inpatient Hospital Stay: Payer: Medicare Other | Admitting: Family Medicine

## 2018-12-30 DIAGNOSIS — Z9049 Acquired absence of other specified parts of digestive tract: Secondary | ICD-10-CM | POA: Diagnosis not present

## 2018-12-30 DIAGNOSIS — Z882 Allergy status to sulfonamides status: Secondary | ICD-10-CM | POA: Diagnosis not present

## 2018-12-30 DIAGNOSIS — K861 Other chronic pancreatitis: Secondary | ICD-10-CM | POA: Diagnosis not present

## 2018-12-30 DIAGNOSIS — K219 Gastro-esophageal reflux disease without esophagitis: Secondary | ICD-10-CM | POA: Diagnosis not present

## 2018-12-30 DIAGNOSIS — K859 Acute pancreatitis without necrosis or infection, unspecified: Secondary | ICD-10-CM | POA: Diagnosis not present

## 2018-12-30 DIAGNOSIS — Z79899 Other long term (current) drug therapy: Secondary | ICD-10-CM | POA: Diagnosis not present

## 2018-12-30 DIAGNOSIS — R1011 Right upper quadrant pain: Secondary | ICD-10-CM | POA: Diagnosis not present

## 2018-12-30 DIAGNOSIS — E119 Type 2 diabetes mellitus without complications: Secondary | ICD-10-CM | POA: Diagnosis not present

## 2018-12-30 DIAGNOSIS — Z794 Long term (current) use of insulin: Secondary | ICD-10-CM | POA: Diagnosis not present

## 2018-12-30 DIAGNOSIS — Z888 Allergy status to other drugs, medicaments and biological substances status: Secondary | ICD-10-CM | POA: Diagnosis not present

## 2018-12-30 DIAGNOSIS — R112 Nausea with vomiting, unspecified: Secondary | ICD-10-CM | POA: Diagnosis not present

## 2018-12-30 DIAGNOSIS — Z880 Allergy status to penicillin: Secondary | ICD-10-CM | POA: Diagnosis not present

## 2018-12-31 ENCOUNTER — Encounter: Payer: Self-pay | Admitting: *Deleted

## 2018-12-31 ENCOUNTER — Inpatient Hospital Stay: Payer: Medicare Other | Admitting: Family Medicine

## 2018-12-31 DIAGNOSIS — E119 Type 2 diabetes mellitus without complications: Secondary | ICD-10-CM | POA: Diagnosis not present

## 2018-12-31 DIAGNOSIS — K859 Acute pancreatitis without necrosis or infection, unspecified: Secondary | ICD-10-CM | POA: Diagnosis not present

## 2019-01-03 ENCOUNTER — Ambulatory Visit: Payer: Self-pay | Admitting: Gastroenterology

## 2019-01-16 ENCOUNTER — Other Ambulatory Visit: Payer: Self-pay | Admitting: Family Medicine

## 2019-01-17 NOTE — Telephone Encounter (Signed)
Requested Prescriptions   Pending Prescriptions Disp Refills  . promethazine (PHENERGAN) 25 MG tablet [Pharmacy Med Name: promethazine 25 mg tablet] 20 tablet 1    Sig: TAKE ONE TABLET BY MOUTH EVERY 6 HOURS AS NEEDED   Last OV 09/30/2018 Last written 11/29/2018

## 2019-02-01 DIAGNOSIS — K219 Gastro-esophageal reflux disease without esophagitis: Secondary | ICD-10-CM | POA: Diagnosis not present

## 2019-02-01 DIAGNOSIS — Z794 Long term (current) use of insulin: Secondary | ICD-10-CM | POA: Diagnosis not present

## 2019-02-01 DIAGNOSIS — D72829 Elevated white blood cell count, unspecified: Secondary | ICD-10-CM | POA: Diagnosis not present

## 2019-02-01 DIAGNOSIS — Z880 Allergy status to penicillin: Secondary | ICD-10-CM | POA: Diagnosis not present

## 2019-02-01 DIAGNOSIS — Z79899 Other long term (current) drug therapy: Secondary | ICD-10-CM | POA: Diagnosis not present

## 2019-02-01 DIAGNOSIS — Z882 Allergy status to sulfonamides status: Secondary | ICD-10-CM | POA: Diagnosis not present

## 2019-02-01 DIAGNOSIS — E1165 Type 2 diabetes mellitus with hyperglycemia: Secondary | ICD-10-CM | POA: Diagnosis not present

## 2019-02-01 DIAGNOSIS — L0231 Cutaneous abscess of buttock: Secondary | ICD-10-CM | POA: Diagnosis not present

## 2019-02-01 DIAGNOSIS — L0232 Furuncle of buttock: Secondary | ICD-10-CM | POA: Diagnosis not present

## 2019-02-01 DIAGNOSIS — Z888 Allergy status to other drugs, medicaments and biological substances status: Secondary | ICD-10-CM | POA: Diagnosis not present

## 2019-02-06 ENCOUNTER — Encounter: Payer: Self-pay | Admitting: Internal Medicine

## 2019-02-06 ENCOUNTER — Telehealth: Payer: Self-pay | Admitting: Internal Medicine

## 2019-02-06 ENCOUNTER — Ambulatory Visit: Payer: Medicare Other | Admitting: Gastroenterology

## 2019-02-06 NOTE — Telephone Encounter (Signed)
PATIENT WAS A NO SHOW AND LETTER SENT  °

## 2019-04-04 ENCOUNTER — Telehealth: Payer: Self-pay | Admitting: Family Medicine

## 2019-04-04 NOTE — Telephone Encounter (Signed)
FYI-  Pt is now receiving case management services

## 2019-04-06 ENCOUNTER — Telehealth: Payer: Self-pay | Admitting: Family Medicine

## 2019-04-06 DIAGNOSIS — Z880 Allergy status to penicillin: Secondary | ICD-10-CM | POA: Diagnosis not present

## 2019-04-06 DIAGNOSIS — K219 Gastro-esophageal reflux disease without esophagitis: Secondary | ICD-10-CM | POA: Diagnosis not present

## 2019-04-06 DIAGNOSIS — R05 Cough: Secondary | ICD-10-CM | POA: Diagnosis not present

## 2019-04-06 DIAGNOSIS — Z794 Long term (current) use of insulin: Secondary | ICD-10-CM | POA: Diagnosis not present

## 2019-04-06 DIAGNOSIS — Z79899 Other long term (current) drug therapy: Secondary | ICD-10-CM | POA: Diagnosis not present

## 2019-04-06 DIAGNOSIS — E119 Type 2 diabetes mellitus without complications: Secondary | ICD-10-CM | POA: Diagnosis not present

## 2019-04-06 DIAGNOSIS — Z882 Allergy status to sulfonamides status: Secondary | ICD-10-CM | POA: Diagnosis not present

## 2019-04-06 DIAGNOSIS — R062 Wheezing: Secondary | ICD-10-CM | POA: Diagnosis not present

## 2019-04-06 DIAGNOSIS — R0602 Shortness of breath: Secondary | ICD-10-CM | POA: Diagnosis not present

## 2019-04-06 DIAGNOSIS — R079 Chest pain, unspecified: Secondary | ICD-10-CM | POA: Diagnosis not present

## 2019-04-06 DIAGNOSIS — Z888 Allergy status to other drugs, medicaments and biological substances status: Secondary | ICD-10-CM | POA: Diagnosis not present

## 2019-04-06 DIAGNOSIS — R5383 Other fatigue: Secondary | ICD-10-CM | POA: Diagnosis not present

## 2019-04-06 NOTE — Telephone Encounter (Signed)
Pt possible exposure to covid now past few days, with drycough, tightness in chest, inhalers not helping, doesn't feel good, no fever.  Advised to go to nearest ER for evaluation, would recommend testing and CXR with difficulty breathing  She voiced understanding she is going to [facility]

## 2019-04-15 ENCOUNTER — Other Ambulatory Visit: Payer: Self-pay

## 2019-04-15 ENCOUNTER — Encounter: Payer: Self-pay | Admitting: Family Medicine

## 2019-04-15 ENCOUNTER — Ambulatory Visit (INDEPENDENT_AMBULATORY_CARE_PROVIDER_SITE_OTHER): Payer: Medicare Other | Admitting: Family Medicine

## 2019-04-15 VITALS — BP 140/78 | HR 116 | Temp 98.1°F | Resp 20 | Ht 67.0 in | Wt 156.0 lb

## 2019-04-15 DIAGNOSIS — M5441 Lumbago with sciatica, right side: Secondary | ICD-10-CM

## 2019-04-15 DIAGNOSIS — M5442 Lumbago with sciatica, left side: Secondary | ICD-10-CM | POA: Diagnosis not present

## 2019-04-15 DIAGNOSIS — E7800 Pure hypercholesterolemia, unspecified: Secondary | ICD-10-CM | POA: Diagnosis not present

## 2019-04-15 DIAGNOSIS — I10 Essential (primary) hypertension: Secondary | ICD-10-CM

## 2019-04-15 DIAGNOSIS — Z23 Encounter for immunization: Secondary | ICD-10-CM

## 2019-04-15 DIAGNOSIS — G8929 Other chronic pain: Secondary | ICD-10-CM

## 2019-04-15 DIAGNOSIS — E119 Type 2 diabetes mellitus without complications: Secondary | ICD-10-CM | POA: Diagnosis not present

## 2019-04-15 MED ORDER — METHOCARBAMOL 750 MG PO TABS: 750.0000 mg | ORAL_TABLET | Freq: Three times a day (TID) | ORAL | 1 refills | Status: DC | PRN

## 2019-04-15 MED ORDER — OXYCODONE-ACETAMINOPHEN 7.5-325 MG PO TABS: 1.0000 | ORAL_TABLET | Freq: Three times a day (TID) | ORAL | 0 refills | Status: DC | PRN

## 2019-04-15 NOTE — Assessment & Plan Note (Addendum)
Uncontrolled, does not follow up regulary  Plan to restart insulin pending A1C results  Check CBG THREE TIMES A DAY

## 2019-04-15 NOTE — Assessment & Plan Note (Signed)
Acute on chronic back pain, s/p fall No red flags on exam Recent steroids so will keep celebrex Change to robaxin, zanaflex not helping Will give only 20tablets of pain medication She needs to call her pain clinic and re-establish  If pain not improved by Friday since she did have fall will obtain xrays, but more MSK pain and spasm noted today

## 2019-04-15 NOTE — Assessment & Plan Note (Signed)
Mildly elevated but also in pain Check renal function labs She is not consistent with meds

## 2019-04-15 NOTE — Patient Instructions (Addendum)
We will call with lab results  Pain medications given  Xray if not improved  F/U 3 months

## 2019-04-15 NOTE — Progress Notes (Signed)
Subjective:    Patient ID: Erica Herrera, female    DOB: April 10, 1966, 53 y.o.   MRN: [id number]  Patient presents for Back Pain (x1 day- S/P fall- mid back pain down to hips- pain radiating down BLE)  Pt here with back pain, she slipped on a container on Sunday within her home. Back was hurting a little. She has chronic back pain with radiculopathy.   Has leg cramps down to toes. Has shooting pain down her legs on both side, R > L.   She has taken the celebrex , she also took the zanaflex but this has not helped    - she has some urinary frequency with diabetes  But occ gets incontinence    No change in bowel movements    She was referred back. Dr. Merlene Laughter but missed appt, still needs to reschedule     DM- has not been taking insulin, she does take some of the oral meds, last A1C 13% in Jan, has missed multiple appt since then or cancelled    Note seen in ER last week due to COVID symptoms, test was negative, given prednisone and albuterol which helpped    Review Of Systems:  GEN- denies fatigue, fever, weight loss,weakness, recent illness HEENT- denies eye drainage, change in vision, nasal discharge, CVS- denies chest pain, palpitations RESP- denies SOB, cough, wheeze ABD- denies N/V, change in stools, abd pain GU- denies dysuria, hematuria, dribbling, incontinence MSK- + joint pain,+ muscle aches, injury Neuro- denies headache, dizziness, syncope, seizure activity       Objective:    BP 140/78   Pulse (!) 116   Temp 98.1 F (36.7 C) (Oral)   Resp 20   Ht 5\' 7"  (1.702 m)   Wt 156 lb (70.8 kg)   SpO2 96%   BMI 24.43 kg/m  GEN- NAD, alert and oriented x3 CVS-tachycardic, no murmur RESP-CTAB ABD-NABS,soft,NT,ND MSK- TTP lumbar spine and lower thoracic spine, + spasm, decreased ROM, neg SLR,   Neuro- antalgic gait, normal tone LE, sensation grossly in tact EXT- No edema Pulses- Radial, DP- 2+        Assessment & Plan:      Problem List Items Addressed  This Visit      Unprioritized   Chronic back pain - Primary    Acute on chronic back pain, s/p fall No red flags on exam Recent steroids so will keep celebrex Change to robaxin, zanaflex not helping Will give only 20tablets of pain medication She needs to call her pain clinic and re-establish  If pain not improved by Friday since she did have fall will obtain xrays, but more MSK pain and spasm noted today      Relevant Medications   methocarbamol (ROBAXIN-750) 750 MG tablet   oxyCODONE-acetaminophen (PERCOCET) 7.5-325 MG tablet   DM (diabetes mellitus) ([place])    Uncontrolled, does not follow up regulary  Plan to restart insulin pending A1C results         Relevant Orders   CBC with Differential/Platelet   Comprehensive metabolic panel   Hemoglobin A1c   Essential hypertension, benign     Mildly elevated but also in pain Check renal function labs She is not consistent with meds      Hyperlipidemia   Relevant Orders   LDL Cholesterol, Direct    Other Visit Diagnoses    Need for immunization against influenza       Relevant Orders   Flu Vaccine QUAD 36+ mos IM (Completed)  Note: This dictation was prepared with Dragon dictation along with smaller phrase technology. Any transcriptional errors that result from this process are unintentional.

## 2019-04-16 ENCOUNTER — Other Ambulatory Visit (HOSPITAL_COMMUNITY): Payer: Self-pay | Admitting: Family Medicine

## 2019-04-16 DIAGNOSIS — Z1231 Encounter for screening mammogram for malignant neoplasm of breast: Secondary | ICD-10-CM

## 2019-04-16 LAB — CBC WITH DIFFERENTIAL/PLATELET
Absolute Monocytes: 524 cells/uL (ref 200–950)
Basophils Absolute: 103 cells/uL (ref 0–200)
Basophils Relative: 0.9 %
Eosinophils Absolute: 171 cells/uL (ref 15–500)
Eosinophils Relative: 1.5 %
HCT: 44.8 % (ref 35.0–45.0)
Hemoglobin: 14.7 g/dL (ref 11.7–15.5)
Lymphs Abs: 4583 cells/uL — ABNORMAL HIGH (ref 850–3900)
MCH: 30.1 pg (ref 27.0–33.0)
MCHC: 32.8 g/dL (ref 32.0–36.0)
MCV: 91.6 fL (ref 80.0–100.0)
MPV: 12 fL (ref 7.5–12.5)
Monocytes Relative: 4.6 %
Neutro Abs: 6019 cells/uL (ref 1500–7800)
Neutrophils Relative %: 52.8 %
Platelets: 242 10*3/uL (ref 140–400)
RBC: 4.89 10*6/uL (ref 3.80–5.10)
RDW: 12.6 % (ref 11.0–15.0)
Total Lymphocyte: 40.2 %
WBC: 11.4 10*3/uL — ABNORMAL HIGH (ref 3.8–10.8)

## 2019-04-16 LAB — COMPREHENSIVE METABOLIC PANEL
AG Ratio: 1.3 (calc) (ref 1.0–2.5)
ALT: 7 U/L (ref 6–29)
AST: 10 U/L (ref 10–35)
Albumin: 4 g/dL (ref 3.6–5.1)
Alkaline phosphatase (APISO): 102 U/L (ref 37–153)
BUN: 10 mg/dL (ref 7–25)
CO2: 22 mmol/L (ref 20–32)
Calcium: 9.7 mg/dL (ref 8.6–10.4)
Chloride: 99 mmol/L (ref 98–110)
Creat: 1.02 mg/dL (ref 0.50–1.05)
Globulin: 3 g/dL (calc) (ref 1.9–3.7)
Glucose, Bld: 498 mg/dL — ABNORMAL HIGH (ref 65–99)
Potassium: 4.3 mmol/L (ref 3.5–5.3)
Sodium: 131 mmol/L — ABNORMAL LOW (ref 135–146)
Total Bilirubin: 0.4 mg/dL (ref 0.2–1.2)
Total Protein: 7 g/dL (ref 6.1–8.1)

## 2019-04-16 LAB — LDL CHOLESTEROL, DIRECT: Direct LDL: 143 mg/dL — ABNORMAL HIGH (ref <100)

## 2019-04-16 LAB — HEMOGLOBIN A1C
Hgb A1c MFr Bld: 13.2 % of total Hgb — ABNORMAL HIGH (ref <5.7)
Mean Plasma Glucose: 332 (calc)
eAG (mmol/L): 18.4 (calc)

## 2019-05-01 ENCOUNTER — Ambulatory Visit (HOSPITAL_COMMUNITY): Payer: Medicare Other

## 2019-05-06 DIAGNOSIS — G43119 Migraine with aura, intractable, without status migrainosus: Secondary | ICD-10-CM | POA: Diagnosis not present

## 2019-05-06 DIAGNOSIS — M5412 Radiculopathy, cervical region: Secondary | ICD-10-CM | POA: Diagnosis not present

## 2019-05-06 DIAGNOSIS — G894 Chronic pain syndrome: Secondary | ICD-10-CM | POA: Diagnosis not present

## 2019-05-06 DIAGNOSIS — M5416 Radiculopathy, lumbar region: Secondary | ICD-10-CM | POA: Diagnosis not present

## 2019-05-07 ENCOUNTER — Encounter: Payer: Self-pay | Admitting: *Deleted

## 2019-05-09 ENCOUNTER — Other Ambulatory Visit: Payer: Self-pay

## 2019-05-09 ENCOUNTER — Ambulatory Visit (HOSPITAL_COMMUNITY)
Admission: RE | Admit: 2019-05-09 | Discharge: 2019-05-09 | Disposition: A | Discharge status: Home / Self Care | Payer: Medicare Other | Source: Ambulatory Visit | Attending: Family Medicine | Admitting: Family Medicine

## 2019-05-09 DIAGNOSIS — Z1231 Encounter for screening mammogram for malignant neoplasm of breast: Secondary | ICD-10-CM | POA: Insufficient documentation

## 2019-05-13 ENCOUNTER — Other Ambulatory Visit (HOSPITAL_COMMUNITY): Payer: Self-pay | Admitting: Family Medicine

## 2019-05-13 DIAGNOSIS — R928 Other abnormal and inconclusive findings on diagnostic imaging of breast: Secondary | ICD-10-CM

## 2019-05-15 ENCOUNTER — Encounter: Payer: Self-pay | Admitting: Family Medicine

## 2019-05-15 DIAGNOSIS — E119 Type 2 diabetes mellitus without complications: Secondary | ICD-10-CM

## 2019-05-20 ENCOUNTER — Ambulatory Visit (HOSPITAL_COMMUNITY): Payer: Medicare Other

## 2019-05-20 ENCOUNTER — Encounter (HOSPITAL_COMMUNITY): Payer: Self-pay

## 2019-05-20 ENCOUNTER — Ambulatory Visit (HOSPITAL_COMMUNITY): Admission: RE | Admit: 2019-05-20 | Payer: Medicare Other | Source: Ambulatory Visit

## 2019-05-27 ENCOUNTER — Other Ambulatory Visit: Payer: Self-pay

## 2019-05-27 DIAGNOSIS — Z794 Long term (current) use of insulin: Secondary | ICD-10-CM | POA: Diagnosis not present

## 2019-05-27 DIAGNOSIS — E119 Type 2 diabetes mellitus without complications: Secondary | ICD-10-CM | POA: Diagnosis not present

## 2019-05-27 DIAGNOSIS — Z888 Allergy status to other drugs, medicaments and biological substances status: Secondary | ICD-10-CM | POA: Diagnosis not present

## 2019-05-27 DIAGNOSIS — Z79899 Other long term (current) drug therapy: Secondary | ICD-10-CM | POA: Diagnosis not present

## 2019-05-27 DIAGNOSIS — K219 Gastro-esophageal reflux disease without esophagitis: Secondary | ICD-10-CM | POA: Diagnosis not present

## 2019-05-27 DIAGNOSIS — Z880 Allergy status to penicillin: Secondary | ICD-10-CM | POA: Diagnosis not present

## 2019-05-27 DIAGNOSIS — Z882 Allergy status to sulfonamides status: Secondary | ICD-10-CM | POA: Diagnosis not present

## 2019-05-27 DIAGNOSIS — S63501A Unspecified sprain of right wrist, initial encounter: Secondary | ICD-10-CM | POA: Diagnosis not present

## 2019-05-27 DIAGNOSIS — X501XXA Overexertion from prolonged static or awkward postures, initial encounter: Secondary | ICD-10-CM | POA: Diagnosis not present

## 2019-05-28 ENCOUNTER — Encounter (HOSPITAL_COMMUNITY): Payer: Medicare Other

## 2019-05-28 ENCOUNTER — Ambulatory Visit (HOSPITAL_COMMUNITY): Payer: Medicare Other

## 2019-05-28 ENCOUNTER — Encounter: Payer: Medicare Other | Admitting: Family Medicine

## 2019-05-29 DIAGNOSIS — G894 Chronic pain syndrome: Secondary | ICD-10-CM | POA: Diagnosis not present

## 2019-05-29 DIAGNOSIS — G43119 Migraine with aura, intractable, without status migrainosus: Secondary | ICD-10-CM | POA: Diagnosis not present

## 2019-05-29 DIAGNOSIS — Z79891 Long term (current) use of opiate analgesic: Secondary | ICD-10-CM | POA: Diagnosis not present

## 2019-05-29 DIAGNOSIS — M5412 Radiculopathy, cervical region: Secondary | ICD-10-CM | POA: Diagnosis not present

## 2019-05-29 DIAGNOSIS — M5416 Radiculopathy, lumbar region: Secondary | ICD-10-CM | POA: Diagnosis not present

## 2019-06-04 ENCOUNTER — Other Ambulatory Visit: Payer: Self-pay

## 2019-06-04 ENCOUNTER — Ambulatory Visit (HOSPITAL_COMMUNITY)
Admission: RE | Admit: 2019-06-04 | Discharge: 2019-06-04 | Disposition: A | Discharge status: Home / Self Care | Payer: Medicare Other | Source: Ambulatory Visit | Attending: Family Medicine | Admitting: Family Medicine

## 2019-06-04 ENCOUNTER — Ambulatory Visit (HOSPITAL_COMMUNITY): Payer: Medicare Other

## 2019-06-04 DIAGNOSIS — N6012 Diffuse cystic mastopathy of left breast: Secondary | ICD-10-CM | POA: Diagnosis not present

## 2019-06-04 DIAGNOSIS — R928 Other abnormal and inconclusive findings on diagnostic imaging of breast: Secondary | ICD-10-CM | POA: Diagnosis not present

## 2019-06-04 DIAGNOSIS — R922 Inconclusive mammogram: Secondary | ICD-10-CM | POA: Diagnosis not present

## 2019-06-04 DIAGNOSIS — N6011 Diffuse cystic mastopathy of right breast: Secondary | ICD-10-CM | POA: Diagnosis not present

## 2019-06-10 ENCOUNTER — Ambulatory Visit (INDEPENDENT_AMBULATORY_CARE_PROVIDER_SITE_OTHER): Payer: Medicare Other | Admitting: "Endocrinology

## 2019-06-10 ENCOUNTER — Encounter: Payer: Self-pay | Admitting: "Endocrinology

## 2019-06-10 ENCOUNTER — Other Ambulatory Visit: Payer: Self-pay

## 2019-06-10 VITALS — BP 116/73 | HR 105 | Ht 67.0 in | Wt 155.0 lb

## 2019-06-10 DIAGNOSIS — E782 Mixed hyperlipidemia: Secondary | ICD-10-CM

## 2019-06-10 DIAGNOSIS — E1165 Type 2 diabetes mellitus with hyperglycemia: Secondary | ICD-10-CM | POA: Diagnosis not present

## 2019-06-10 MED ORDER — TOUJEO SOLOSTAR 300 UNIT/ML ~~LOC~~ SOPN: 30.0000 [IU] | PEN_INJECTOR | Freq: Every day | SUBCUTANEOUS | 2 refills | Status: DC

## 2019-06-10 MED ORDER — ROSUVASTATIN CALCIUM 10 MG PO TABS: 10.0000 mg | ORAL_TABLET | Freq: Every day | ORAL | 1 refills | Status: DC

## 2019-06-10 NOTE — Patient Instructions (Signed)
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
## 2019-06-16 ENCOUNTER — Other Ambulatory Visit: Payer: Self-pay | Admitting: Family Medicine

## 2019-06-16 NOTE — Telephone Encounter (Signed)
Ok to refill Robaxin and Lamictal?

## 2019-06-16 NOTE — Telephone Encounter (Signed)
Call placed to patient. LMTRC.  

## 2019-06-16 NOTE — Telephone Encounter (Signed)
Pt suppose to be seeing a psychiatrist, not sure who has been giving her these meds recently I will only give 30 days of zoloft, lamictal, zanaflex

## 2019-06-17 NOTE — Telephone Encounter (Signed)
Call placed to patient. LMTRC.  

## 2019-06-23 ENCOUNTER — Ambulatory Visit: Payer: Medicare Other | Admitting: "Endocrinology

## 2019-06-23 ENCOUNTER — Other Ambulatory Visit: Payer: Self-pay

## 2019-07-09 DIAGNOSIS — G894 Chronic pain syndrome: Secondary | ICD-10-CM | POA: Diagnosis not present

## 2019-07-09 DIAGNOSIS — G43119 Migraine with aura, intractable, without status migrainosus: Secondary | ICD-10-CM | POA: Diagnosis not present

## 2019-07-09 DIAGNOSIS — M5416 Radiculopathy, lumbar region: Secondary | ICD-10-CM | POA: Diagnosis not present

## 2019-07-09 DIAGNOSIS — Z79891 Long term (current) use of opiate analgesic: Secondary | ICD-10-CM | POA: Diagnosis not present

## 2019-07-09 DIAGNOSIS — M5412 Radiculopathy, cervical region: Secondary | ICD-10-CM | POA: Diagnosis not present

## 2019-07-16 ENCOUNTER — Encounter: Payer: Self-pay | Admitting: Family Medicine

## 2019-07-16 ENCOUNTER — Ambulatory Visit: Payer: Medicare Other | Admitting: Family Medicine

## 2019-07-29 ENCOUNTER — Ambulatory Visit: Payer: Medicare Other | Admitting: Nutrition

## 2019-08-17 DIAGNOSIS — Z888 Allergy status to other drugs, medicaments and biological substances status: Secondary | ICD-10-CM | POA: Diagnosis not present

## 2019-08-17 DIAGNOSIS — J029 Acute pharyngitis, unspecified: Secondary | ICD-10-CM | POA: Diagnosis not present

## 2019-08-17 DIAGNOSIS — E119 Type 2 diabetes mellitus without complications: Secondary | ICD-10-CM | POA: Diagnosis not present

## 2019-08-17 DIAGNOSIS — Z720 Tobacco use: Secondary | ICD-10-CM | POA: Diagnosis not present

## 2019-08-17 DIAGNOSIS — K219 Gastro-esophageal reflux disease without esophagitis: Secondary | ICD-10-CM | POA: Diagnosis not present

## 2019-08-17 DIAGNOSIS — Z880 Allergy status to penicillin: Secondary | ICD-10-CM | POA: Diagnosis not present

## 2019-08-17 DIAGNOSIS — Z79899 Other long term (current) drug therapy: Secondary | ICD-10-CM | POA: Diagnosis not present

## 2019-08-17 DIAGNOSIS — Z794 Long term (current) use of insulin: Secondary | ICD-10-CM | POA: Diagnosis not present

## 2019-08-17 DIAGNOSIS — Z882 Allergy status to sulfonamides status: Secondary | ICD-10-CM | POA: Diagnosis not present

## 2019-08-19 DIAGNOSIS — H9202 Otalgia, left ear: Secondary | ICD-10-CM | POA: Diagnosis not present

## 2019-08-19 DIAGNOSIS — M26622 Arthralgia of left temporomandibular joint: Secondary | ICD-10-CM | POA: Diagnosis not present

## 2019-08-19 DIAGNOSIS — R0989 Other specified symptoms and signs involving the circulatory and respiratory systems: Secondary | ICD-10-CM | POA: Diagnosis not present

## 2019-08-19 DIAGNOSIS — J3089 Other allergic rhinitis: Secondary | ICD-10-CM | POA: Diagnosis not present

## 2019-09-15 ENCOUNTER — Ambulatory Visit: Payer: Medicare Other | Admitting: Nutrition

## 2019-09-15 ENCOUNTER — Telehealth: Payer: Self-pay | Admitting: Nutrition

## 2019-09-15 NOTE — Telephone Encounter (Signed)
VM left to call back to reschedule missed appt. °

## 2019-09-24 DIAGNOSIS — Z720 Tobacco use: Secondary | ICD-10-CM | POA: Diagnosis not present

## 2019-09-24 DIAGNOSIS — J029 Acute pharyngitis, unspecified: Secondary | ICD-10-CM | POA: Diagnosis not present

## 2019-09-24 DIAGNOSIS — E119 Type 2 diabetes mellitus without complications: Secondary | ICD-10-CM | POA: Diagnosis not present

## 2019-09-24 DIAGNOSIS — Z79899 Other long term (current) drug therapy: Secondary | ICD-10-CM | POA: Diagnosis not present

## 2019-09-24 DIAGNOSIS — K219 Gastro-esophageal reflux disease without esophagitis: Secondary | ICD-10-CM | POA: Diagnosis not present

## 2019-09-24 DIAGNOSIS — Z20822 Contact with and (suspected) exposure to covid-19: Secondary | ICD-10-CM | POA: Diagnosis not present

## 2019-09-24 DIAGNOSIS — R000 Tachycardia, unspecified: Secondary | ICD-10-CM | POA: Diagnosis not present

## 2019-09-24 DIAGNOSIS — M1990 Unspecified osteoarthritis, unspecified site: Secondary | ICD-10-CM | POA: Diagnosis not present

## 2019-09-24 DIAGNOSIS — R05 Cough: Secondary | ICD-10-CM | POA: Diagnosis not present

## 2019-09-24 DIAGNOSIS — Z7984 Long term (current) use of oral hypoglycemic drugs: Secondary | ICD-10-CM | POA: Diagnosis not present

## 2019-09-26 DIAGNOSIS — R0602 Shortness of breath: Secondary | ICD-10-CM | POA: Diagnosis not present

## 2019-09-26 DIAGNOSIS — R519 Headache, unspecified: Secondary | ICD-10-CM | POA: Diagnosis not present

## 2019-09-26 DIAGNOSIS — R05 Cough: Secondary | ICD-10-CM | POA: Diagnosis not present

## 2019-09-26 DIAGNOSIS — R110 Nausea: Secondary | ICD-10-CM | POA: Diagnosis not present

## 2019-10-01 DIAGNOSIS — E113293 Type 2 diabetes mellitus with mild nonproliferative diabetic retinopathy without macular edema, bilateral: Secondary | ICD-10-CM | POA: Diagnosis not present

## 2019-10-01 DIAGNOSIS — H5213 Myopia, bilateral: Secondary | ICD-10-CM | POA: Diagnosis not present

## 2019-10-01 DIAGNOSIS — H25813 Combined forms of age-related cataract, bilateral: Secondary | ICD-10-CM | POA: Diagnosis not present

## 2019-10-01 DIAGNOSIS — H524 Presbyopia: Secondary | ICD-10-CM | POA: Diagnosis not present

## 2019-10-01 DIAGNOSIS — H52223 Regular astigmatism, bilateral: Secondary | ICD-10-CM | POA: Diagnosis not present

## 2019-10-14 DIAGNOSIS — G8929 Other chronic pain: Secondary | ICD-10-CM | POA: Diagnosis not present

## 2019-10-14 DIAGNOSIS — E785 Hyperlipidemia, unspecified: Secondary | ICD-10-CM | POA: Diagnosis not present

## 2019-10-14 DIAGNOSIS — Z79899 Other long term (current) drug therapy: Secondary | ICD-10-CM | POA: Diagnosis not present

## 2019-10-14 DIAGNOSIS — R5382 Chronic fatigue, unspecified: Secondary | ICD-10-CM | POA: Diagnosis not present

## 2019-10-14 DIAGNOSIS — J45909 Unspecified asthma, uncomplicated: Secondary | ICD-10-CM | POA: Diagnosis not present

## 2019-10-14 DIAGNOSIS — E119 Type 2 diabetes mellitus without complications: Secondary | ICD-10-CM | POA: Diagnosis not present

## 2019-10-16 ENCOUNTER — Encounter: Payer: Self-pay | Admitting: Internal Medicine

## 2019-10-17 ENCOUNTER — Other Ambulatory Visit (HOSPITAL_COMMUNITY): Payer: Self-pay | Admitting: Sports Medicine

## 2019-10-17 ENCOUNTER — Other Ambulatory Visit: Payer: Self-pay | Admitting: Sports Medicine

## 2019-10-17 DIAGNOSIS — R918 Other nonspecific abnormal finding of lung field: Secondary | ICD-10-CM

## 2019-10-20 ENCOUNTER — Encounter: Payer: Self-pay | Admitting: Nutrition

## 2019-10-20 ENCOUNTER — Telehealth: Payer: Self-pay | Admitting: "Endocrinology

## 2019-10-20 ENCOUNTER — Other Ambulatory Visit: Payer: Self-pay

## 2019-10-20 ENCOUNTER — Encounter: Payer: Medicare Other | Attending: "Endocrinology | Admitting: Nutrition

## 2019-10-20 ENCOUNTER — Other Ambulatory Visit: Payer: Self-pay | Admitting: "Endocrinology

## 2019-10-20 VITALS — Ht 68.0 in | Wt 158.9 lb

## 2019-10-20 DIAGNOSIS — I10 Essential (primary) hypertension: Secondary | ICD-10-CM

## 2019-10-20 DIAGNOSIS — E1165 Type 2 diabetes mellitus with hyperglycemia: Secondary | ICD-10-CM

## 2019-10-20 DIAGNOSIS — E782 Mixed hyperlipidemia: Secondary | ICD-10-CM

## 2019-10-20 NOTE — Telephone Encounter (Signed)
Pt wants to be seen. Does she need any labs?

## 2019-10-20 NOTE — Progress Notes (Signed)
  Medical Nutrition Therapy:  Appt start time: 1430 end time:  1530.   Assessment:  Primary concerns today: DIabetes Type 2 x 15 yrs. Dr. Garnetta Buddy, [facility]. Just started seeing Dr. Fransico Him, Endocrinology. Lives with  One of her daughters. She and her daughter cooks and shop. She notes she is in a lot of pain and only thing that helps her is morphine. Going to see therapist starting tomorrow. Bipolar. Has  depression. Just started on Elavil. Has trouble with eating in am . Eats 2 meals a day. A1C.10.5%. Down from 11.5%. HIstory of pancreatitis x 4 times in the last 2 years. Toujeo 30 units. Drinks sweet tea. Not much water. Diet is low in fresh fruits, vegetables and whole grains. Willing to work on eating better balanced meals, eating on time and taking medications as prescribed.   Preferred Learning Style:  Visual   No preference indicated    Learning Readiness:    Ready  Change in progress   MEDICATIONS:    DIETARY INTAKE:    24-hr recall:  B ( AM): Skips Snk ( AM): L ( PM): leftovers, coffee, Snk ( PM): D ( PM): Steak and shrimp, salad, baked potato, 1/2, coffee and sweet tea Snk ( PM): Beverages: sweet tea  Usual physical activity: ADL  Estimated energy needs: 1500 calories 170 g carbohydrates 112 g protein 42 g fat  Progress Towards Goal(s):  In progress.   Nutritional Diagnosis:  NB-1.1 Food and nutrition-related knowledge deficit As related to Diabetes Type 2.  As evidenced by A1C 10.5%.    Intervention:  Nutrition and Diabetes education provided on My Plate, CHO counting, meal planning, portion sizes, timing of meals, avoiding snacks between meals unless having a low blood sugar, target ranges for A1C and blood sugars, signs/symptoms and treatment of hyper/hypoglycemia, monitoring blood sugars, taking medications as prescribed, benefits of exercising 30 minutes per day and prevention of complications of DM. Marland Kitchen  Teaching Method Utilized:   Visual Auditory Hands on  Handouts given during visit include:  The Plate Method  Meal Plan Card Diabetes Instructions.   Barriers to learning/adherence to lifestyle change: none  Demonstrated degree of understanding via:  Teach Back   Monitoring/Evaluation:  Dietary intake, exercise,  body weight in 1 month(s).

## 2019-10-20 NOTE — Telephone Encounter (Signed)
Yes, she needs a complete lab. I will order. She also needs to bring her meter, logs.

## 2019-10-20 NOTE — Patient Instructions (Addendum)
Goals  Eat breakfast daily. Take insulin on schedule Testing blood sugars twice a day Don't don't pinch skin when giving insulin Take 30 Toujeo at night Cut back on the sugar. Get A1C down to 7%.

## 2019-10-23 ENCOUNTER — Encounter: Payer: Self-pay | Admitting: Nutrition

## 2019-10-28 DIAGNOSIS — Z62820 Parent-biological child conflict: Secondary | ICD-10-CM | POA: Diagnosis not present
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
## 2019-10-31 ENCOUNTER — Telehealth: Payer: Self-pay | Admitting: Internal Medicine

## 2019-10-31 ENCOUNTER — Ambulatory Visit: Payer: Medicare Other | Admitting: Gastroenterology

## 2019-10-31 NOTE — Telephone Encounter (Signed)
PATIENT NO SHOW X 3

## 2019-11-03 ENCOUNTER — Ambulatory Visit (HOSPITAL_COMMUNITY)
Admission: RE | Admit: 2019-11-03 | Discharge: 2019-11-03 | Disposition: A | Discharge status: Home / Self Care | Payer: Medicare Other | Source: Ambulatory Visit | Attending: Sports Medicine | Admitting: Sports Medicine

## 2019-11-03 ENCOUNTER — Other Ambulatory Visit: Payer: Self-pay

## 2019-11-03 DIAGNOSIS — R918 Other nonspecific abnormal finding of lung field: Secondary | ICD-10-CM | POA: Diagnosis not present

## 2019-11-03 DIAGNOSIS — J439 Emphysema, unspecified: Secondary | ICD-10-CM | POA: Diagnosis not present

## 2019-11-03 LAB — POCT I-STAT CREATININE: Creatinine, Ser: 0.8 mg/dL (ref 0.44–1.00)

## 2019-11-03 MED ORDER — IOHEXOL 300 MG/ML  SOLN
75.0000 mL | Freq: Once | INTRAMUSCULAR | Status: AC | PRN
  Administered 2019-11-03: 75 mL via INTRAVENOUS

## 2019-11-03 MED ORDER — IOHEXOL 300 MG/ML  SOLN: 100.0000 mL | Freq: Once | INTRAMUSCULAR | Status: DC | PRN

## 2019-11-11 DIAGNOSIS — E119 Type 2 diabetes mellitus without complications: Secondary | ICD-10-CM | POA: Diagnosis not present

## 2019-11-11 DIAGNOSIS — R5382 Chronic fatigue, unspecified: Secondary | ICD-10-CM | POA: Diagnosis not present

## 2019-11-11 DIAGNOSIS — Z79899 Other long term (current) drug therapy: Secondary | ICD-10-CM | POA: Diagnosis not present

## 2019-11-11 DIAGNOSIS — J45909 Unspecified asthma, uncomplicated: Secondary | ICD-10-CM | POA: Diagnosis not present

## 2019-11-26 ENCOUNTER — Ambulatory Visit: Payer: Medicare Other | Admitting: Nutrition

## 2019-12-03 DIAGNOSIS — Z62820 Parent-biological child conflict: Secondary | ICD-10-CM | POA: Diagnosis not present

## 2019-12-09 DIAGNOSIS — R739 Hyperglycemia, unspecified: Secondary | ICD-10-CM | POA: Diagnosis not present

## 2019-12-09 DIAGNOSIS — Z79899 Other long term (current) drug therapy: Secondary | ICD-10-CM | POA: Diagnosis not present

## 2019-12-09 DIAGNOSIS — R5382 Chronic fatigue, unspecified: Secondary | ICD-10-CM | POA: Diagnosis not present

## 2019-12-09 DIAGNOSIS — E785 Hyperlipidemia, unspecified: Secondary | ICD-10-CM | POA: Diagnosis not present

## 2019-12-30 DIAGNOSIS — M25562 Pain in left knee: Secondary | ICD-10-CM | POA: Diagnosis not present

## 2019-12-30 DIAGNOSIS — M533 Sacrococcygeal disorders, not elsewhere classified: Secondary | ICD-10-CM | POA: Diagnosis not present

## 2019-12-30 DIAGNOSIS — M25552 Pain in left hip: Secondary | ICD-10-CM | POA: Diagnosis not present

## 2019-12-30 DIAGNOSIS — M545 Low back pain: Secondary | ICD-10-CM | POA: Diagnosis not present

## 2019-12-30 DIAGNOSIS — G8929 Other chronic pain: Secondary | ICD-10-CM | POA: Diagnosis not present

## 2020-01-06 DIAGNOSIS — G4700 Insomnia, unspecified: Secondary | ICD-10-CM | POA: Diagnosis not present

## 2020-01-07 DIAGNOSIS — Z79891 Long term (current) use of opiate analgesic: Secondary | ICD-10-CM | POA: Diagnosis not present

## 2020-01-07 DIAGNOSIS — M5412 Radiculopathy, cervical region: Secondary | ICD-10-CM | POA: Diagnosis not present

## 2020-01-07 DIAGNOSIS — M461 Sacroiliitis, not elsewhere classified: Secondary | ICD-10-CM | POA: Diagnosis not present

## 2020-01-07 DIAGNOSIS — M5416 Radiculopathy, lumbar region: Secondary | ICD-10-CM | POA: Diagnosis not present

## 2020-01-07 DIAGNOSIS — G5603 Carpal tunnel syndrome, bilateral upper limbs: Secondary | ICD-10-CM | POA: Diagnosis not present

## 2020-01-12 ENCOUNTER — Other Ambulatory Visit: Payer: Self-pay | Admitting: Neurology

## 2020-01-12 ENCOUNTER — Telehealth (HOSPITAL_COMMUNITY): Payer: Self-pay | Admitting: Neurology

## 2020-01-12 ENCOUNTER — Other Ambulatory Visit (HOSPITAL_COMMUNITY): Payer: Self-pay | Admitting: Neurology

## 2020-01-12 DIAGNOSIS — M5416 Radiculopathy, lumbar region: Secondary | ICD-10-CM

## 2020-01-12 NOTE — Telephone Encounter (Signed)
01/12/20~LVM on [employer]. MF

## 2020-01-15 DIAGNOSIS — M25562 Pain in left knee: Secondary | ICD-10-CM | POA: Diagnosis not present

## 2020-01-21 DIAGNOSIS — S8992XD Unspecified injury of left lower leg, subsequent encounter: Secondary | ICD-10-CM | POA: Diagnosis not present

## 2020-02-04 ENCOUNTER — Other Ambulatory Visit: Payer: Self-pay

## 2020-02-04 ENCOUNTER — Ambulatory Visit (HOSPITAL_COMMUNITY)
Admission: RE | Admit: 2020-02-04 | Discharge: 2020-02-04 | Disposition: A | Discharge status: Home / Self Care | Payer: Medicare Other | Source: Ambulatory Visit | Attending: Neurology | Admitting: Neurology

## 2020-02-04 DIAGNOSIS — M5416 Radiculopathy, lumbar region: Secondary | ICD-10-CM | POA: Diagnosis not present

## 2020-02-04 DIAGNOSIS — M545 Low back pain: Secondary | ICD-10-CM | POA: Diagnosis not present

## 2020-02-05 DIAGNOSIS — M5412 Radiculopathy, cervical region: Secondary | ICD-10-CM | POA: Diagnosis not present

## 2020-02-05 DIAGNOSIS — M5416 Radiculopathy, lumbar region: Secondary | ICD-10-CM | POA: Diagnosis not present

## 2020-02-05 DIAGNOSIS — G894 Chronic pain syndrome: Secondary | ICD-10-CM | POA: Diagnosis not present

## 2020-02-05 DIAGNOSIS — G5603 Carpal tunnel syndrome, bilateral upper limbs: Secondary | ICD-10-CM | POA: Diagnosis not present

## 2020-02-05 DIAGNOSIS — G4700 Insomnia, unspecified: Secondary | ICD-10-CM | POA: Diagnosis not present

## 2020-02-05 DIAGNOSIS — Z79891 Long term (current) use of opiate analgesic: Secondary | ICD-10-CM | POA: Diagnosis not present

## 2020-03-01 DIAGNOSIS — R030 Elevated blood-pressure reading, without diagnosis of hypertension: Secondary | ICD-10-CM | POA: Diagnosis not present

## 2020-03-01 DIAGNOSIS — M48062 Spinal stenosis, lumbar region with neurogenic claudication: Secondary | ICD-10-CM | POA: Diagnosis not present

## 2020-03-04 DIAGNOSIS — M5412 Radiculopathy, cervical region: Secondary | ICD-10-CM | POA: Diagnosis not present

## 2020-03-04 DIAGNOSIS — M5416 Radiculopathy, lumbar region: Secondary | ICD-10-CM | POA: Diagnosis not present

## 2020-03-04 DIAGNOSIS — M545 Low back pain: Secondary | ICD-10-CM | POA: Diagnosis not present

## 2020-03-04 DIAGNOSIS — G4700 Insomnia, unspecified: Secondary | ICD-10-CM | POA: Diagnosis not present

## 2020-03-04 DIAGNOSIS — Z79891 Long term (current) use of opiate analgesic: Secondary | ICD-10-CM | POA: Diagnosis not present

## 2020-03-08 DIAGNOSIS — J440 Chronic obstructive pulmonary disease with acute lower respiratory infection: Secondary | ICD-10-CM | POA: Diagnosis not present

## 2020-03-09 ENCOUNTER — Other Ambulatory Visit: Payer: Self-pay | Admitting: Neurosurgery

## 2020-03-09 ENCOUNTER — Other Ambulatory Visit (HOSPITAL_COMMUNITY): Payer: Self-pay | Admitting: Neurosurgery

## 2020-03-09 DIAGNOSIS — M48062 Spinal stenosis, lumbar region with neurogenic claudication: Secondary | ICD-10-CM

## 2020-03-22 ENCOUNTER — Ambulatory Visit (HOSPITAL_COMMUNITY)
Admission: RE | Admit: 2020-03-22 | Discharge: 2020-03-22 | Disposition: A | Discharge status: Home / Self Care | Payer: Medicare Other | Source: Ambulatory Visit | Attending: Neurosurgery | Admitting: Neurosurgery

## 2020-03-22 ENCOUNTER — Other Ambulatory Visit: Payer: Self-pay

## 2020-03-22 DIAGNOSIS — Z9049 Acquired absence of other specified parts of digestive tract: Secondary | ICD-10-CM | POA: Diagnosis not present

## 2020-03-22 DIAGNOSIS — M48062 Spinal stenosis, lumbar region with neurogenic claudication: Secondary | ICD-10-CM

## 2020-03-22 DIAGNOSIS — M48061 Spinal stenosis, lumbar region without neurogenic claudication: Secondary | ICD-10-CM | POA: Diagnosis not present

## 2020-03-22 DIAGNOSIS — I7090 Unspecified atherosclerosis: Secondary | ICD-10-CM | POA: Insufficient documentation

## 2020-03-22 DIAGNOSIS — M4326 Fusion of spine, lumbar region: Secondary | ICD-10-CM | POA: Diagnosis not present

## 2020-03-22 DIAGNOSIS — K861 Other chronic pancreatitis: Secondary | ICD-10-CM | POA: Diagnosis not present

## 2020-03-22 DIAGNOSIS — M5137 Other intervertebral disc degeneration, lumbosacral region: Secondary | ICD-10-CM | POA: Diagnosis not present

## 2020-03-22 DIAGNOSIS — M545 Low back pain: Secondary | ICD-10-CM | POA: Diagnosis not present

## 2020-03-22 DIAGNOSIS — M4316 Spondylolisthesis, lumbar region: Secondary | ICD-10-CM | POA: Diagnosis not present

## 2020-03-22 LAB — GLUCOSE, CAPILLARY
Glucose-Capillary: 347 mg/dL — ABNORMAL HIGH (ref 70–99)
Glucose-Capillary: 165 mg/dL — ABNORMAL HIGH (ref 70–99)

## 2020-03-22 MED ORDER — IOHEXOL 300 MG/ML  SOLN: 10.0000 mL | Freq: Once | INTRAMUSCULAR | Status: DC | PRN

## 2020-03-22 MED ORDER — INSULIN ASPART 100 UNIT/ML ~~LOC~~ SOLN
SUBCUTANEOUS | Status: AC
  Administered 2020-03-22: 20 [IU] via SUBCUTANEOUS
  Filled 2020-03-22: qty 1

## 2020-03-22 MED ORDER — ONDANSETRON HCL 4 MG/2ML IJ SOLN: 4.0000 mg | Freq: Four times a day (QID) | INTRAMUSCULAR | Status: DC | PRN

## 2020-03-22 MED ORDER — DIAZEPAM 5 MG PO TABS
ORAL_TABLET | ORAL | Status: AC
  Administered 2020-03-22: 10 mg via ORAL
  Filled 2020-03-22: qty 2

## 2020-03-22 MED ORDER — DIAZEPAM 5 MG PO TABS: 10.0000 mg | ORAL_TABLET | Freq: Once | ORAL | Status: AC

## 2020-03-22 MED ORDER — OXYCODONE HCL 5 MG PO TABS
ORAL_TABLET | ORAL | Status: AC
  Administered 2020-03-22: 5 mg via ORAL
  Filled 2020-03-22: qty 1

## 2020-03-22 MED ORDER — OXYCODONE HCL 5 MG PO TABS: 5.0000 mg | ORAL_TABLET | ORAL | Status: DC | PRN

## 2020-03-22 MED ORDER — INSULIN ASPART 100 UNIT/ML ~~LOC~~ SOLN: 20.0000 [IU] | Freq: Once | SUBCUTANEOUS | Status: AC

## 2020-03-22 MED ORDER — IOHEXOL 180 MG/ML  SOLN
20.0000 mL | Freq: Once | INTRAMUSCULAR | Status: AC | PRN
  Administered 2020-03-22: 20 mL via INTRATHECAL

## 2020-03-22 MED ORDER — LIDOCAINE HCL (PF) 1 % IJ SOLN
5.0000 mL | Freq: Once | INTRAMUSCULAR | Status: AC
  Administered 2020-03-22: 5 mL via INTRADERMAL

## 2020-03-22 NOTE — Discharge Instructions (Signed)
Myelogram  A myelogram is an imaging study of the spinal cord and the places where nerves attach to the spinal cord (nerve roots). A dye (contrast material) is injected into the spine before the X-ray. This provides a clearer image for your health care provider to see. You may need this study done if you have a spinal cord problem that cannot be diagnosed with other imaging studies, such as a CT scan or an MRI. You may also have this study to check your spine after surgery. Tell a health care provider about:  Any allergies you have, especially to iodine.  All medicines you are taking, including vitamins, herbs, eye drops, creams, and over-the-counter medicines.  Any problems you or family members have had with anesthetic medicines or contrast material.  Any blood disorders you have.  Any surgeries you have had.  Any medical conditions you have or have had, including asthma.  Whether you are pregnant or may be pregnant. What are the risks? Generally, this is a safe procedure. However, problems may occur, including:  Infection.  Bleeding.  Allergic reaction to medicines or dyes.  Damage to your spinal cord or nerves.  Loss or leaking of spinal fluid. This can lead to headaches.  Damage to kidneys.  Seizures. This is rare. What happens before the procedure?  Follow instructions from your health care provider about eating or drinking restrictions. You may be asked to drink more fluids.  Ask your health care provider about changing or stopping your regular medicines. This is especially important if you are taking diabetes medicines or blood thinners.  Plan to have someone take you home from the hospital or clinic.  If you will be going home right after the procedure, plan to have someone with you for 24 hours. What happens during the procedure?  You will lie face down on a table.  Your health care provider will locate the best injection site on your spine. This is most  often in the lower back.  The area of injection will be washed with soap.  You will be given a medicine to numb the area (local anesthetic).  Your health care provider will insert a long needle into the space around your spinal cord (subarachnoid space).  A sample of spinal fluid may be taken and sent to the lab for testing.  The contrast material will be injected into the subarachnoid space.  The exam table may be tilted to help the contrast material flow up or down your spine.  The X-ray will take images of your spinal cord for your health care provider to examine.  A bandage (dressing) may be placed over the injection site. The procedure may vary among health care providers and hospitals. What can I expect after this procedure?  Your blood pressure, heart rate, breathing rate, and blood oxygen level may be monitored until you leave the hospital or clinic.  You may have: ? Soreness on your injection site. ? A mild headache.  You will be asked to lie down with your head raised (elevated). This reduces the risk of a headache.  It is up to you to get the results of your procedure. Ask your health care provider, or the department that is doing the procedure, when your results will be ready. Follow these instructions at home:   Rest as told by your health care provider. Lie flat with your head slightly elevated to reduce the risk of a headache.  Do not bend, lift, or do hard work   for 24-48 hours, or as told by your health care provider.  Take over-the-counter and prescription medicines only as told by your health care provider.  Take care of your dressing as told by your health care provider.  Drink enough fluid to keep your urine pale yellow.  Bathe or shower as told by your health care provider. Contact a health care provider if:  You have a fever.  You have a headache that lasts longer than 24 hours.  You feel nauseous or vomit.  You have a stiff neck or numbness in  your legs.  You are unable to urinate or have a bowel movement.  You develop a rash, itching, or sneezing. Get help right away if:  You have new symptoms or your symptoms get worse.  You have a seizure.  You have trouble breathing. Summary  A myelogram is an imaging study of the spinal cord and the places where nerves attach to the spinal cord (nerve roots).  Before the procedure, follow instructions from your health care provider about changing or stopping your regular medicines, and eating and drinking restrictions.  After this procedure, you will be asked to lie down with your head raised (elevated). This reduces your risk of a headache.  Do not bend, lift, or do hard work for 24-48 hours, or as told by your health care provider.  Contact a health care provider if you have a stiff neck or numbness in your legs. Get help right away if symptoms get worse, or you have a seizure or trouble breathing. This information is not intended to replace advice given to you by your health care provider. Make sure you discuss any questions you have with your health care provider. Document Revised: 10/16/2018 Document Reviewed: 10/17/2018 Elsevier Patient Education  2020 [employer] Inc[employer]  

## 2020-03-22 NOTE — Op Note (Signed)
BP (!) 110/47   Pulse 88   Temp (!) 97.5 F (36.4 C) (Oral)   Resp 14   Ht 5\' 7"  (1.702 m)   Wt 72 kg   SpO2 95%   BMI 24.86 kg/m  03/22/2020 lumbar Myelogram  PATIENT:  Erica Herrera is a 54 y.o. female  PRE-OPERATIVE DIAGNOSIS:  lumbago  POST-OPERATIVE DIAGNOSIS:  lumbago  PROCEDURE:  Lumbar Myelogram  SURGEON:  Hana Trippett  ANESTHESIA:   local LOCAL MEDICATIONS USED:  LIDOCAINE  Procedure Note: Erica Herrera is a 54 y.o. female Was taken to the fluoroscopy suite and  positioned prone on the fluoroscopy table. Her back was prepared and draped in a sterile manner. I infiltrated 5 cc into the lumbar region. I then introduced a spinal needle into the thecal sac at the L4/5 interlaminar space. I infiltrated 20cc of Isovue 180 into the thecal sac. Fluoroscopy showed the needle and contrast in the thecal sac. Erica Herrera tolerated the procedure well. she Will be taken to CT for evaluation.     PATIENT DISPOSITION:  Short Stay

## 2020-03-29 DIAGNOSIS — M5126 Other intervertebral disc displacement, lumbar region: Secondary | ICD-10-CM | POA: Diagnosis not present

## 2020-03-29 DIAGNOSIS — Z6823 Body mass index (BMI) 23.0-23.9, adult: Secondary | ICD-10-CM | POA: Diagnosis not present

## 2020-03-29 DIAGNOSIS — M5137 Other intervertebral disc degeneration, lumbosacral region: Secondary | ICD-10-CM | POA: Diagnosis not present

## 2020-04-01 DIAGNOSIS — G5603 Carpal tunnel syndrome, bilateral upper limbs: Secondary | ICD-10-CM | POA: Diagnosis not present

## 2020-04-01 DIAGNOSIS — M5416 Radiculopathy, lumbar region: Secondary | ICD-10-CM | POA: Diagnosis not present

## 2020-04-01 DIAGNOSIS — M461 Sacroiliitis, not elsewhere classified: Secondary | ICD-10-CM | POA: Diagnosis not present

## 2020-04-01 DIAGNOSIS — M5412 Radiculopathy, cervical region: Secondary | ICD-10-CM | POA: Diagnosis not present

## 2020-04-01 DIAGNOSIS — M545 Low back pain: Secondary | ICD-10-CM | POA: Diagnosis not present

## 2020-04-14 DIAGNOSIS — Z01812 Encounter for preprocedural laboratory examination: Secondary | ICD-10-CM | POA: Diagnosis not present

## 2020-04-14 DIAGNOSIS — J45909 Unspecified asthma, uncomplicated: Secondary | ICD-10-CM | POA: Diagnosis not present

## 2020-04-14 DIAGNOSIS — Z20822 Contact with and (suspected) exposure to covid-19: Secondary | ICD-10-CM | POA: Diagnosis not present

## 2020-04-14 DIAGNOSIS — Z01818 Encounter for other preprocedural examination: Secondary | ICD-10-CM | POA: Diagnosis not present

## 2020-04-14 DIAGNOSIS — Z01810 Encounter for preprocedural cardiovascular examination: Secondary | ICD-10-CM | POA: Diagnosis not present

## 2020-04-14 DIAGNOSIS — J449 Chronic obstructive pulmonary disease, unspecified: Secondary | ICD-10-CM | POA: Diagnosis not present

## 2020-04-16 DIAGNOSIS — M5412 Radiculopathy, cervical region: Secondary | ICD-10-CM | POA: Diagnosis not present

## 2020-04-16 DIAGNOSIS — M5126 Other intervertebral disc displacement, lumbar region: Secondary | ICD-10-CM | POA: Diagnosis not present

## 2020-04-16 DIAGNOSIS — I10 Essential (primary) hypertension: Secondary | ICD-10-CM | POA: Diagnosis not present

## 2020-04-16 DIAGNOSIS — I2510 Atherosclerotic heart disease of native coronary artery without angina pectoris: Secondary | ICD-10-CM | POA: Diagnosis not present

## 2020-04-16 DIAGNOSIS — M48061 Spinal stenosis, lumbar region without neurogenic claudication: Secondary | ICD-10-CM | POA: Diagnosis not present

## 2020-04-16 DIAGNOSIS — M48062 Spinal stenosis, lumbar region with neurogenic claudication: Secondary | ICD-10-CM | POA: Diagnosis not present

## 2020-04-16 DIAGNOSIS — Z79899 Other long term (current) drug therapy: Secondary | ICD-10-CM | POA: Diagnosis not present

## 2020-04-16 DIAGNOSIS — J449 Chronic obstructive pulmonary disease, unspecified: Secondary | ICD-10-CM | POA: Diagnosis not present

## 2020-04-16 DIAGNOSIS — Z981 Arthrodesis status: Secondary | ICD-10-CM | POA: Diagnosis not present

## 2020-04-16 DIAGNOSIS — E119 Type 2 diabetes mellitus without complications: Secondary | ICD-10-CM | POA: Diagnosis not present

## 2020-04-16 DIAGNOSIS — M5417 Radiculopathy, lumbosacral region: Secondary | ICD-10-CM | POA: Diagnosis not present

## 2020-04-24 DIAGNOSIS — J449 Chronic obstructive pulmonary disease, unspecified: Secondary | ICD-10-CM | POA: Diagnosis not present

## 2020-04-24 DIAGNOSIS — Z8673 Personal history of transient ischemic attack (TIA), and cerebral infarction without residual deficits: Secondary | ICD-10-CM | POA: Diagnosis not present

## 2020-04-24 DIAGNOSIS — I252 Old myocardial infarction: Secondary | ICD-10-CM | POA: Diagnosis not present

## 2020-04-24 DIAGNOSIS — Z9049 Acquired absence of other specified parts of digestive tract: Secondary | ICD-10-CM | POA: Diagnosis not present

## 2020-04-24 DIAGNOSIS — E785 Hyperlipidemia, unspecified: Secondary | ICD-10-CM | POA: Diagnosis not present

## 2020-04-24 DIAGNOSIS — E119 Type 2 diabetes mellitus without complications: Secondary | ICD-10-CM | POA: Diagnosis not present

## 2020-04-24 DIAGNOSIS — I10 Essential (primary) hypertension: Secondary | ICD-10-CM | POA: Diagnosis not present

## 2020-04-24 DIAGNOSIS — M96842 Postprocedural seroma of a musculoskeletal structure following a musculoskeletal system procedure: Secondary | ICD-10-CM | POA: Diagnosis not present

## 2020-05-10 DIAGNOSIS — I639 Cerebral infarction, unspecified: Secondary | ICD-10-CM | POA: Diagnosis not present

## 2020-05-10 DIAGNOSIS — I110 Hypertensive heart disease with heart failure: Secondary | ICD-10-CM | POA: Diagnosis not present

## 2020-05-10 DIAGNOSIS — F17210 Nicotine dependence, cigarettes, uncomplicated: Secondary | ICD-10-CM | POA: Diagnosis not present

## 2020-05-10 DIAGNOSIS — I351 Nonrheumatic aortic (valve) insufficiency: Secondary | ICD-10-CM | POA: Diagnosis not present

## 2020-05-10 DIAGNOSIS — I6621 Occlusion and stenosis of right posterior cerebral artery: Secondary | ICD-10-CM | POA: Diagnosis not present

## 2020-05-10 DIAGNOSIS — Z8739 Personal history of other diseases of the musculoskeletal system and connective tissue: Secondary | ICD-10-CM | POA: Diagnosis not present

## 2020-05-10 DIAGNOSIS — G936 Cerebral edema: Secondary | ICD-10-CM | POA: Diagnosis not present

## 2020-05-10 DIAGNOSIS — R29818 Other symptoms and signs involving the nervous system: Secondary | ICD-10-CM | POA: Diagnosis not present

## 2020-05-10 DIAGNOSIS — I252 Old myocardial infarction: Secondary | ICD-10-CM | POA: Diagnosis not present

## 2020-05-10 DIAGNOSIS — R29702 NIHSS score 2: Secondary | ICD-10-CM | POA: Diagnosis not present

## 2020-05-10 DIAGNOSIS — Z882 Allergy status to sulfonamides status: Secondary | ICD-10-CM | POA: Diagnosis not present

## 2020-05-10 DIAGNOSIS — E1165 Type 2 diabetes mellitus with hyperglycemia: Secondary | ICD-10-CM | POA: Diagnosis not present

## 2020-05-10 DIAGNOSIS — R479 Unspecified speech disturbances: Secondary | ICD-10-CM | POA: Diagnosis not present

## 2020-05-10 DIAGNOSIS — Z720 Tobacco use: Secondary | ICD-10-CM | POA: Diagnosis not present

## 2020-05-10 DIAGNOSIS — I63312 Cerebral infarction due to thrombosis of left middle cerebral artery: Secondary | ICD-10-CM | POA: Diagnosis not present

## 2020-05-10 DIAGNOSIS — G8929 Other chronic pain: Secondary | ICD-10-CM | POA: Diagnosis not present

## 2020-05-10 DIAGNOSIS — Z8679 Personal history of other diseases of the circulatory system: Secondary | ICD-10-CM | POA: Diagnosis not present

## 2020-05-10 DIAGNOSIS — M4802 Spinal stenosis, cervical region: Secondary | ICD-10-CM | POA: Diagnosis not present

## 2020-05-10 DIAGNOSIS — E785 Hyperlipidemia, unspecified: Secondary | ICD-10-CM | POA: Diagnosis not present

## 2020-05-10 DIAGNOSIS — I5189 Other ill-defined heart diseases: Secondary | ICD-10-CM | POA: Diagnosis not present

## 2020-05-10 DIAGNOSIS — Z794 Long term (current) use of insulin: Secondary | ICD-10-CM | POA: Diagnosis not present

## 2020-05-10 DIAGNOSIS — Z9049 Acquired absence of other specified parts of digestive tract: Secondary | ICD-10-CM | POA: Diagnosis not present

## 2020-05-10 DIAGNOSIS — I10 Essential (primary) hypertension: Secondary | ICD-10-CM | POA: Diagnosis not present

## 2020-05-10 DIAGNOSIS — G8321 Monoplegia of upper limb affecting right dominant side: Secondary | ICD-10-CM | POA: Diagnosis not present

## 2020-05-10 DIAGNOSIS — I6603 Occlusion and stenosis of bilateral middle cerebral arteries: Secondary | ICD-10-CM | POA: Diagnosis not present

## 2020-05-10 DIAGNOSIS — I6523 Occlusion and stenosis of bilateral carotid arteries: Secondary | ICD-10-CM | POA: Diagnosis not present

## 2020-05-10 DIAGNOSIS — Z7982 Long term (current) use of aspirin: Secondary | ICD-10-CM | POA: Diagnosis not present

## 2020-05-10 DIAGNOSIS — Z880 Allergy status to penicillin: Secondary | ICD-10-CM | POA: Diagnosis not present

## 2020-05-10 DIAGNOSIS — Z79899 Other long term (current) drug therapy: Secondary | ICD-10-CM | POA: Diagnosis not present

## 2020-05-10 DIAGNOSIS — I517 Cardiomegaly: Secondary | ICD-10-CM | POA: Diagnosis not present

## 2020-05-10 DIAGNOSIS — J449 Chronic obstructive pulmonary disease, unspecified: Secondary | ICD-10-CM | POA: Diagnosis not present

## 2020-05-27 DIAGNOSIS — G894 Chronic pain syndrome: Secondary | ICD-10-CM | POA: Diagnosis not present

## 2020-05-27 DIAGNOSIS — Z79891 Long term (current) use of opiate analgesic: Secondary | ICD-10-CM | POA: Diagnosis not present

## 2020-05-27 DIAGNOSIS — I639 Cerebral infarction, unspecified: Secondary | ICD-10-CM | POA: Diagnosis not present

## 2020-05-27 DIAGNOSIS — G4700 Insomnia, unspecified: Secondary | ICD-10-CM | POA: Diagnosis not present

## 2020-05-27 DIAGNOSIS — M5416 Radiculopathy, lumbar region: Secondary | ICD-10-CM | POA: Diagnosis not present

## 2020-05-27 DIAGNOSIS — M545 Low back pain, unspecified: Secondary | ICD-10-CM | POA: Diagnosis not present

## 2020-06-02 DIAGNOSIS — I4891 Unspecified atrial fibrillation: Secondary | ICD-10-CM | POA: Diagnosis not present

## 2020-06-08 DIAGNOSIS — I4891 Unspecified atrial fibrillation: Secondary | ICD-10-CM | POA: Diagnosis not present

## 2020-06-08 DIAGNOSIS — I639 Cerebral infarction, unspecified: Secondary | ICD-10-CM | POA: Diagnosis not present

## 2020-06-08 DIAGNOSIS — E119 Type 2 diabetes mellitus without complications: Secondary | ICD-10-CM | POA: Diagnosis not present

## 2020-06-08 DIAGNOSIS — F17200 Nicotine dependence, unspecified, uncomplicated: Secondary | ICD-10-CM | POA: Diagnosis not present

## 2020-06-08 DIAGNOSIS — Z23 Encounter for immunization: Secondary | ICD-10-CM | POA: Diagnosis not present

## 2020-06-08 DIAGNOSIS — I10 Essential (primary) hypertension: Secondary | ICD-10-CM | POA: Diagnosis not present

## 2020-06-28 DIAGNOSIS — G5603 Carpal tunnel syndrome, bilateral upper limbs: Secondary | ICD-10-CM | POA: Diagnosis not present

## 2020-06-28 DIAGNOSIS — M5416 Radiculopathy, lumbar region: Secondary | ICD-10-CM | POA: Diagnosis not present

## 2020-06-28 DIAGNOSIS — Z79891 Long term (current) use of opiate analgesic: Secondary | ICD-10-CM | POA: Diagnosis not present

## 2020-06-28 DIAGNOSIS — M461 Sacroiliitis, not elsewhere classified: Secondary | ICD-10-CM | POA: Diagnosis not present

## 2020-06-28 DIAGNOSIS — G894 Chronic pain syndrome: Secondary | ICD-10-CM | POA: Diagnosis not present

## 2020-06-28 DIAGNOSIS — M5412 Radiculopathy, cervical region: Secondary | ICD-10-CM | POA: Diagnosis not present

## 2020-06-28 DIAGNOSIS — M545 Low back pain, unspecified: Secondary | ICD-10-CM | POA: Diagnosis not present

## 2021-01-09 IMAGING — CT CT CHEST W/O CM
2 of 3 series · 15 of 36 positions shown, 18 images · non-contrast
Comparison: 08/18/2018

CLINICAL DATA: Cough and fatigue.

EXAM:
CT CHEST WITHOUT CONTRAST
TECHNIQUE: Multidetector CT imaging of the chest was performed following the
standard protocol without IV contrast.

[Series 2: thorax · axial · 0.65mm/px · z∈[+1237,+1519]mm · 12 of 167 slices shown, 15 images]
[im 13/167  mediastinal]
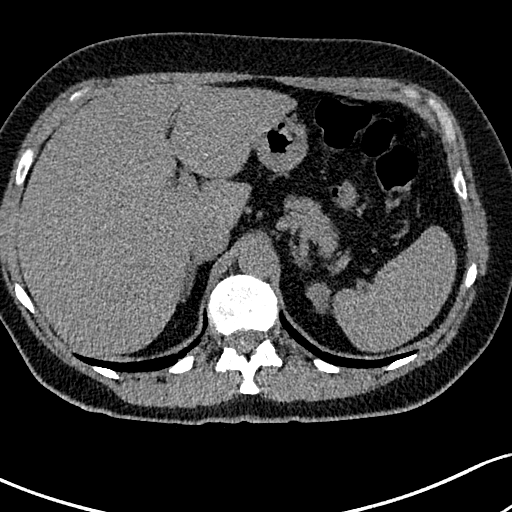
[im 13/167  lung]
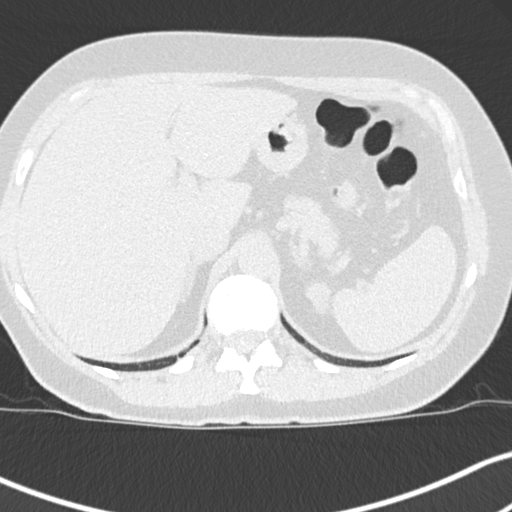
[im 25/167  lung]
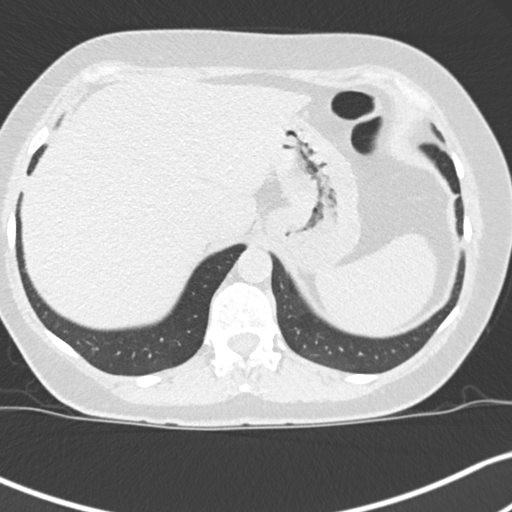
[im 37/167  lung]
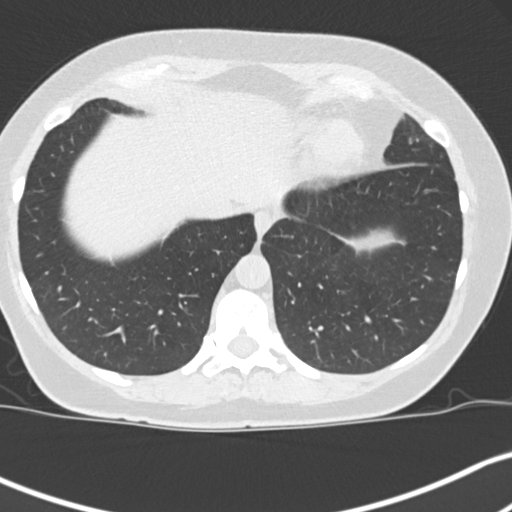
[im 50/167  lung]
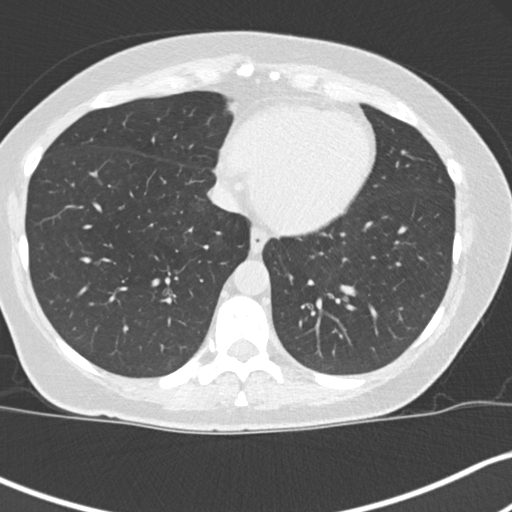
[im 62/167  mediastinal]
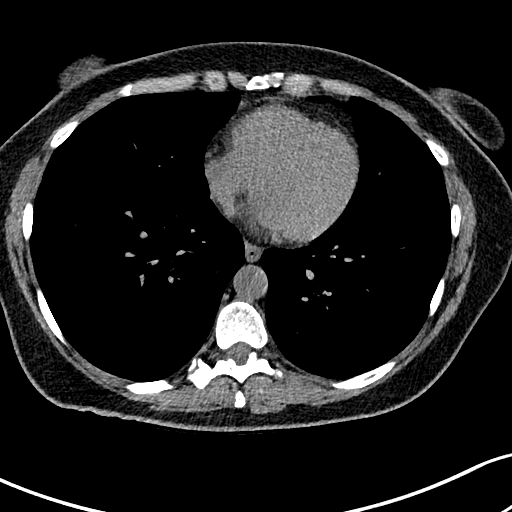
[im 62/167  lung]
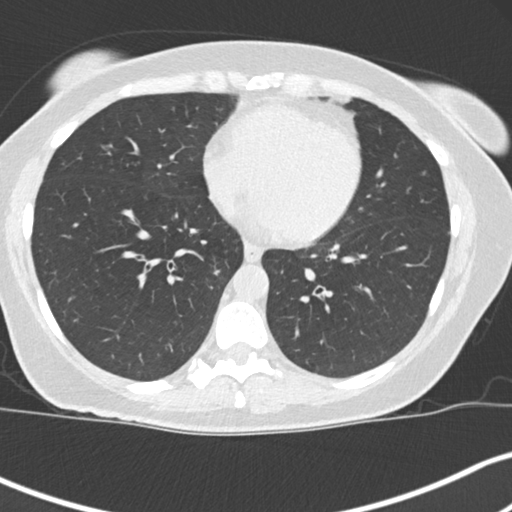
[im 74/167  lung]
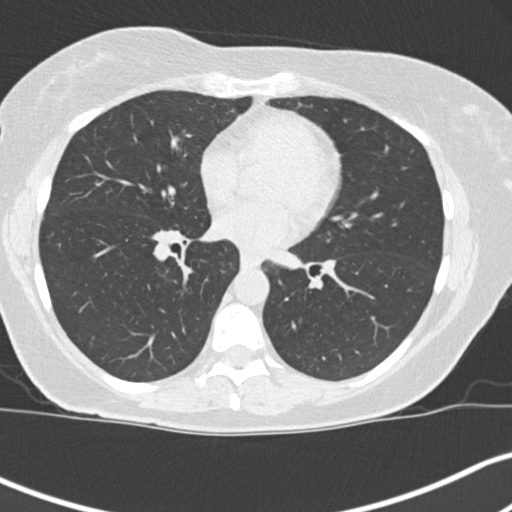
[im 93/167  lung]
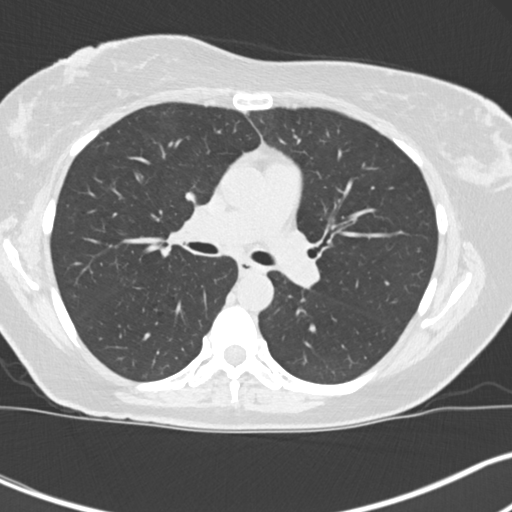
[im 105/167  lung]
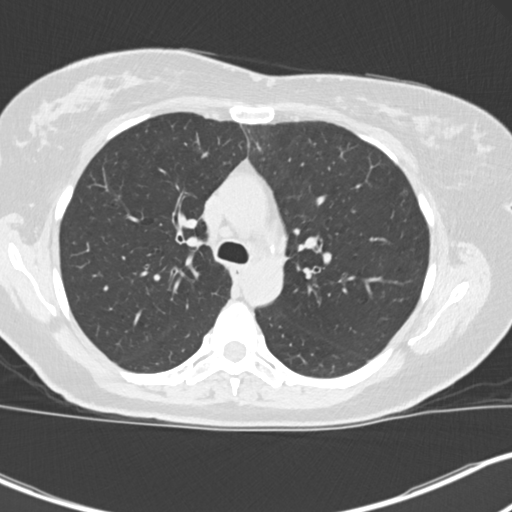
[im 117/167  mediastinal]
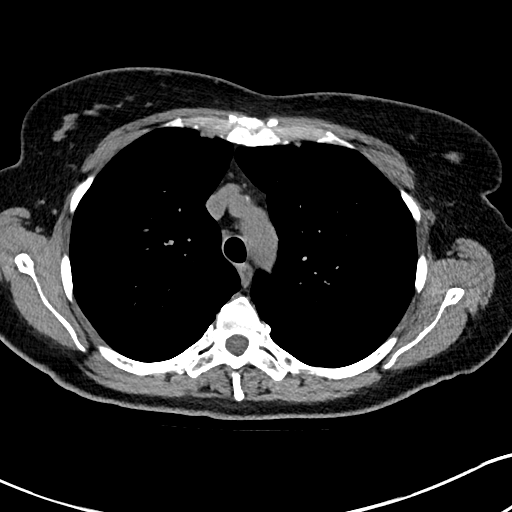
[im 117/167  lung]
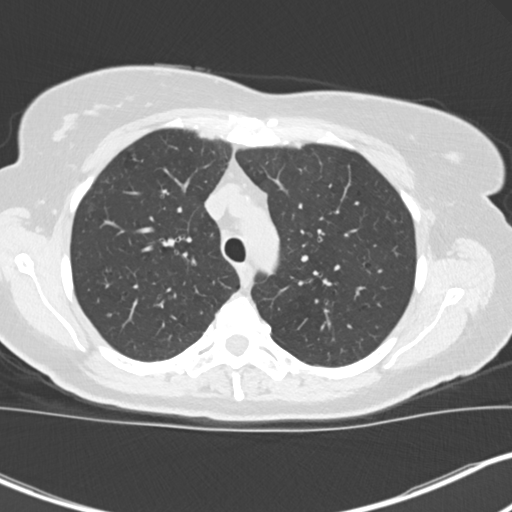
[im 130/167  lung]
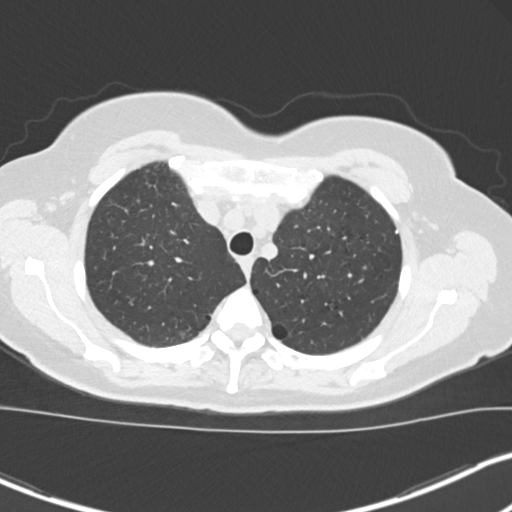
[im 142/167  lung]
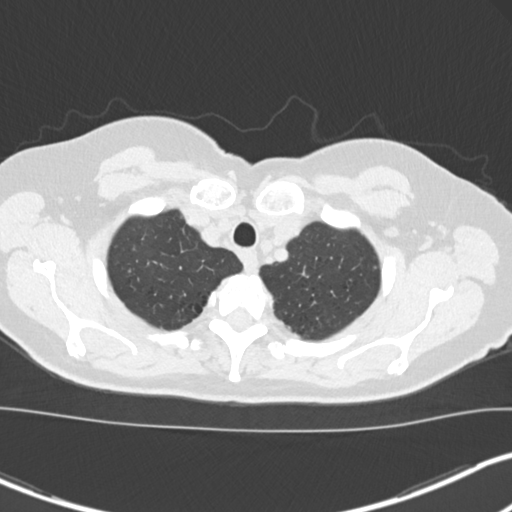
[im 154/167  lung]
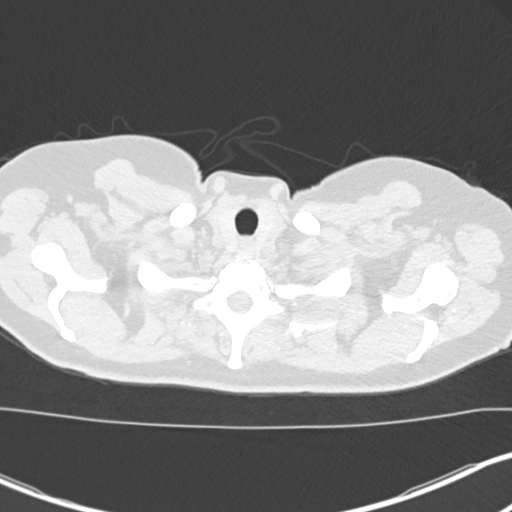

[Series 5: coronal · coronal · 0.60mm/px · 3 of 114 slices shown]
[im 23/114  lung]
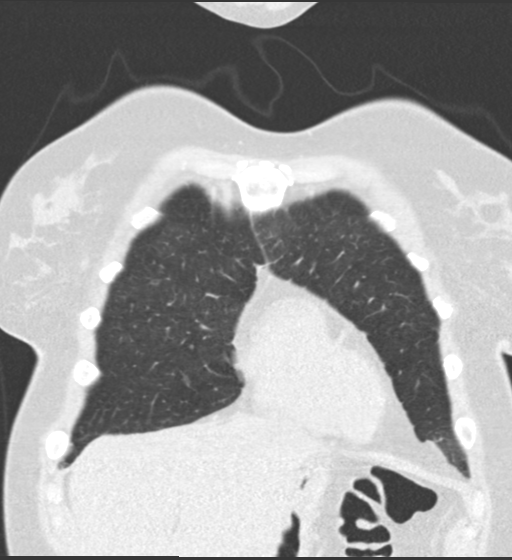
[im 46/114  lung]
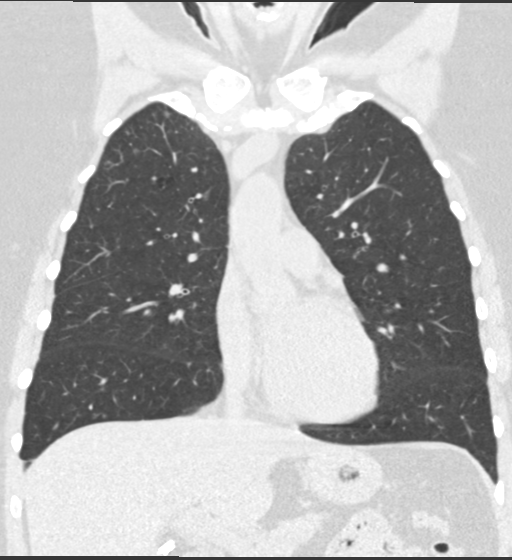
[im 68/114  lung]
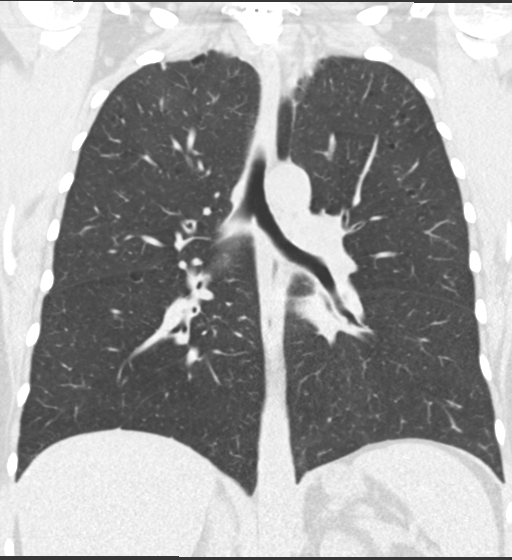

[15 of 36 positions shown; findings below may reference images not displayed]

FINDINGS: Cardiovascular: Heart size appears normal. No pericardial effusion
identified. Aortic atherosclerosis.

Mediastinum/Nodes: No enlarged mediastinal or axillary lymph nodes.
Thyroid gland, trachea, and esophagus demonstrate no significant
findings.

Lungs/Pleura: No pleural effusion. No airspace consolidation,
atelectasis or pneumothorax. There is moderate change of
centrilobular emphysema with diffuse bronchial wall thickening.
There are scattered small nonspecific pulmonary nodules noted:

-index nodule in the right upper lobe measures 2 mm, image 56/4.

-Index nodule in the left upper lobe measures 3 mm, image 59/4.

-Right upper lobe subpleural nodule measures 4 mm, image 59/4.

-right middle lobe subpleural nodule measures 4 mm, image 93/4.

-superior segment of left lower lobe nodule measures 4 mm, image
60/4.

Upper Abdomen: No acute abnormality.

Musculoskeletal: No chest wall mass or suspicious bone lesions
identified.
IMPRESSION: 1. No acute cardiopulmonary abnormalities.
2. Scattered small nonspecific pulmonary nodules are identified in
both lungs. No follow-up needed if patient is low-risk (and has no
known or suspected primary neoplasm). Non-contrast chest CT can be
considered in 12 months if patient is high-risk. This recommendation
follows the consensus statement: Guidelines for Management of
Incidental Pulmonary Nodules Detected on CT Images: From the
3. Diffuse bronchial wall thickening with emphysema, as above;
imaging findings suggestive of underlying COPD.

Aortic Atherosclerosis (OW7VD-MBH.H) and Emphysema (OW7VD-2L8.M).

## 2021-12-07 ENCOUNTER — Other Ambulatory Visit (HOSPITAL_COMMUNITY): Payer: Self-pay | Admitting: Sports Medicine

## 2021-12-07 DIAGNOSIS — Z1231 Encounter for screening mammogram for malignant neoplasm of breast: Secondary | ICD-10-CM

## 2021-12-26 ENCOUNTER — Ambulatory Visit (HOSPITAL_COMMUNITY): Payer: Medicare Other

## 2022-01-18 ENCOUNTER — Encounter: Payer: Self-pay | Admitting: *Deleted

## 2022-06-26 ENCOUNTER — Encounter: Payer: Self-pay | Admitting: *Deleted

## 2023-01-03 LAB — TSH: TSH: 0.4 — AB (ref 0.41–5.90)

## 2023-01-03 LAB — COMPREHENSIVE METABOLIC PANEL: eGFR: 84

## 2023-01-03 LAB — BASIC METABOLIC PANEL
BUN: 6 (ref 4–21)
Creatinine: 0.8 (ref 0.5–1.1)
Glucose: 490

## 2023-01-03 LAB — LIPID PANEL
LDL Cholesterol: 23
Triglycerides: 173 — AB (ref 40–160)

## 2023-01-03 LAB — HEMOGLOBIN A1C: Hemoglobin A1C: 11.7
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
## 2023-01-09 ENCOUNTER — Encounter: Payer: Self-pay | Admitting: Gastroenterology

## 2023-01-10 ENCOUNTER — Ambulatory Visit: Payer: 59 | Admitting: Gastroenterology

## 2023-01-11 ENCOUNTER — Encounter: Payer: Self-pay | Admitting: Gastroenterology

## 2023-04-11 LAB — HEMOGLOBIN A1C: Hemoglobin A1C: >15

## 2023-05-16 ENCOUNTER — Other Ambulatory Visit (HOSPITAL_COMMUNITY): Payer: Self-pay | Admitting: Family Medicine

## 2023-05-16 DIAGNOSIS — N6452 Nipple discharge: Secondary | ICD-10-CM

## 2023-06-19 ENCOUNTER — Other Ambulatory Visit (HOSPITAL_COMMUNITY): Payer: Self-pay | Admitting: Family Medicine

## 2023-06-19 DIAGNOSIS — N6452 Nipple discharge: Secondary | ICD-10-CM

## 2023-06-25 NOTE — Patient Instructions (Signed)

## 2023-06-26 ENCOUNTER — Encounter: Payer: Self-pay | Admitting: Nurse Practitioner

## 2023-06-26 ENCOUNTER — Ambulatory Visit (INDEPENDENT_AMBULATORY_CARE_PROVIDER_SITE_OTHER): Payer: 59 | Admitting: Nurse Practitioner

## 2023-06-26 VITALS — BP 106/66 | HR 102 | Ht 67.0 in | Wt 115.0 lb

## 2023-06-26 DIAGNOSIS — E782 Mixed hyperlipidemia: Secondary | ICD-10-CM

## 2023-06-26 DIAGNOSIS — R7989 Other specified abnormal findings of blood chemistry: Secondary | ICD-10-CM

## 2023-06-26 DIAGNOSIS — E1165 Type 2 diabetes mellitus with hyperglycemia: Secondary | ICD-10-CM

## 2023-06-26 DIAGNOSIS — Z794 Long term (current) use of insulin: Secondary | ICD-10-CM

## 2023-06-26 DIAGNOSIS — I10 Essential (primary) hypertension: Secondary | ICD-10-CM

## 2023-06-26 DIAGNOSIS — Z7984 Long term (current) use of oral hypoglycemic drugs: Secondary | ICD-10-CM
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
## 2023-10-01 ENCOUNTER — Telehealth: Payer: Self-pay | Admitting: Nurse Practitioner

## 2023-10-01 ENCOUNTER — Other Ambulatory Visit: Payer: Self-pay | Admitting: Nurse Practitioner

## 2023-10-01 MED ORDER — LANTUS SOLOSTAR 100 UNIT/ML ~~LOC~~ SOPN: 25.0000 [IU] | PEN_INJECTOR | Freq: Every day | SUBCUTANEOUS | 0 refills | Status: AC

## 2023-10-01 NOTE — Telephone Encounter (Signed)
 I sent in a limited supply of insulin  for her until she gets established with a  PCP there.

## 2023-10-01 NOTE — Telephone Encounter (Signed)
 Pt said she did move but tried to get a refill in January. She is asking if you can refill it. Lantus .  [facility] [place] and they will transfer it. Please Advise

## 2023-10-02 ENCOUNTER — Ambulatory Visit: Payer: 59 | Admitting: Nurse Practitioner

## 2023-11-24 ENCOUNTER — Other Ambulatory Visit: Payer: Self-pay | Admitting: Nurse Practitioner

## 2024-06-18 ENCOUNTER — Encounter: Payer: Self-pay | Admitting: *Deleted
